# Patient Record
Sex: Male | Born: 1985 | Race: Black or African American | Hispanic: No | Marital: Single | Smoking: Never smoker
Health system: Southern US, Community
[De-identification: ages and names within clinical notes are randomized; demographics above are authoritative.]

## PROBLEM LIST (undated history)

## (undated) DIAGNOSIS — R600 Localized edema: Secondary | ICD-10-CM

## (undated) DIAGNOSIS — E785 Hyperlipidemia, unspecified: Secondary | ICD-10-CM

## (undated) DIAGNOSIS — I10 Essential (primary) hypertension: Secondary | ICD-10-CM

## (undated) DIAGNOSIS — N529 Male erectile dysfunction, unspecified: Secondary | ICD-10-CM

## (undated) DIAGNOSIS — E119 Type 2 diabetes mellitus without complications: Secondary | ICD-10-CM

## (undated) DIAGNOSIS — E663 Overweight: Secondary | ICD-10-CM

## (undated) HISTORY — DX: Overweight: E66.3

## (undated) HISTORY — DX: Male erectile dysfunction, unspecified: N52.9

## (undated) HISTORY — PX: KNEE SURGERY: SHX244

## (undated) HISTORY — DX: Hyperlipidemia, unspecified: E78.5

## (undated) HISTORY — DX: Localized edema: R60.0

---
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
## 2008-08-13 ENCOUNTER — Emergency Department (HOSPITAL_COMMUNITY): Admission: EM | Admit: 2008-08-13 | Discharge: 2008-08-13 | Payer: Self-pay | Admitting: Emergency Medicine

## 2009-07-09 ENCOUNTER — Emergency Department (HOSPITAL_COMMUNITY): Admission: EM | Admit: 2009-07-09 | Discharge: 2009-07-10 | Payer: Self-pay | Admitting: Emergency Medicine

## 2009-10-14 ENCOUNTER — Emergency Department (HOSPITAL_COMMUNITY): Admission: EM | Admit: 2009-10-14 | Discharge: 2009-10-14 | Payer: Self-pay | Admitting: Emergency Medicine

## 2009-10-15 ENCOUNTER — Inpatient Hospital Stay (HOSPITAL_COMMUNITY): Admission: EM | Admit: 2009-10-15 | Discharge: 2009-10-17 | Payer: Self-pay | Admitting: Emergency Medicine

## 2010-01-29 ENCOUNTER — Emergency Department (HOSPITAL_COMMUNITY): Admission: EM | Admit: 2010-01-29 | Discharge: 2010-01-29 | Payer: Self-pay | Admitting: Emergency Medicine

## 2010-05-05 ENCOUNTER — Emergency Department (HOSPITAL_COMMUNITY)
Admission: EM | Admit: 2010-05-05 | Discharge: 2010-05-05 | Payer: Self-pay | Source: Home / Self Care | Admitting: Emergency Medicine

## 2010-06-20 LAB — DIFFERENTIAL
Basophils Relative: 1 % (ref 0–1)
Eosinophils Absolute: 0.2 10*3/uL (ref 0.0–0.7)
Neutro Abs: 7.2 10*3/uL (ref 1.7–7.7)
Neutrophils Relative %: 67 % (ref 43–77)

## 2010-06-20 LAB — CBC
Hemoglobin: 16.6 g/dL (ref 13.0–17.0)
MCH: 27.8 pg (ref 26.0–34.0)
MCHC: 33.3 g/dL (ref 30.0–36.0)
Platelets: 334 10*3/uL (ref 150–400)
RBC: 5.96 MIL/uL — ABNORMAL HIGH (ref 4.22–5.81)

## 2010-06-20 LAB — POCT I-STAT, CHEM 8
Chloride: 108 mEq/L (ref 96–112)
HCT: 55 % — ABNORMAL HIGH (ref 39.0–52.0)
Hemoglobin: 18.7 g/dL — ABNORMAL HIGH (ref 13.0–17.0)
Potassium: 3.7 mEq/L (ref 3.5–5.1)
Sodium: 137 mEq/L (ref 135–145)

## 2010-06-20 LAB — POCT CARDIAC MARKERS
CKMB, poc: <1 ng/mL — ABNORMAL LOW (ref 1.0–8.0)
Myoglobin, poc: 68.3 ng/mL (ref 12–200)
Troponin i, poc: <0.05 ng/mL (ref 0.00–0.09)

## 2010-06-24 LAB — CBC
HCT: 38 % — ABNORMAL LOW (ref 39.0–52.0)
HCT: 42.5 % (ref 39.0–52.0)
Hemoglobin: 14.9 g/dL (ref 13.0–17.0)
MCHC: 34.3 g/dL (ref 30.0–36.0)
MCV: 83.2 fL (ref 78.0–100.0)
MCV: 84.4 fL (ref 78.0–100.0)
RBC: 5.11 MIL/uL (ref 4.22–5.81)
RDW: 12.6 % (ref 11.5–15.5)
RDW: 13.6 % (ref 11.5–15.5)
WBC: 18.7 10*3/uL — ABNORMAL HIGH (ref 4.0–10.5)

## 2010-06-24 LAB — BASIC METABOLIC PANEL
BUN: 17 mg/dL (ref 6–23)
Chloride: 100 mEq/L (ref 96–112)
Chloride: 112 mEq/L (ref 96–112)
GFR calc non Af Amer: >60 mL/min (ref >60)
Glucose, Bld: 136 mg/dL — ABNORMAL HIGH (ref 70–99)
Glucose, Bld: 161 mg/dL — ABNORMAL HIGH (ref 70–99)
Potassium: 3.3 mEq/L — ABNORMAL LOW (ref 3.5–5.1)
Potassium: 4.5 mEq/L (ref 3.5–5.1)
Sodium: 136 mEq/L (ref 135–145)

## 2010-06-24 LAB — CULTURE, BLOOD (ROUTINE X 2)
Culture: NO GROWTH
Culture: NO GROWTH

## 2010-06-24 LAB — WOUND CULTURE

## 2010-06-24 LAB — DIFFERENTIAL
Basophils Absolute: 0.2 10*3/uL — ABNORMAL HIGH (ref 0.0–0.1)
Eosinophils Relative: 1 % (ref 0–5)
Lymphocytes Relative: 14 % (ref 12–46)
Monocytes Relative: 11 % (ref 3–12)
Neutro Abs: 13.6 10*3/uL — ABNORMAL HIGH (ref 1.7–7.7)

## 2010-06-27 LAB — POCT I-STAT, CHEM 8
Calcium, Ion: 1.19 mmol/L (ref 1.12–1.32)
Creatinine, Ser: 1.3 mg/dL (ref 0.4–1.5)
Glucose, Bld: 116 mg/dL — ABNORMAL HIGH (ref 70–99)
Hemoglobin: 18.4 g/dL — ABNORMAL HIGH (ref 13.0–17.0)
TCO2: 30 mmol/L (ref 0–100)

## 2010-07-17 LAB — POCT CARDIAC MARKERS
CKMB, poc: 1.4 ng/mL (ref 1.0–8.0)
Troponin i, poc: <0.05 ng/mL (ref 0.00–0.09)

## 2010-07-17 LAB — POCT I-STAT, CHEM 8
Chloride: 104 mEq/L (ref 96–112)
Creatinine, Ser: 1.5 mg/dL (ref 0.4–1.5)
Glucose, Bld: 126 mg/dL — ABNORMAL HIGH (ref 70–99)
HCT: 54 % — ABNORMAL HIGH (ref 39.0–52.0)
Potassium: 3.7 mEq/L (ref 3.5–5.1)

## 2010-08-24 NOTE — Op Note (Signed)
NAME:  Francisco Carey, Francisco Carey NO.:  [id number]   MEDICAL RECORD NO.:  [id number]          PATIENT TYPE:  AMB   LOCATION:  DSC                          FACILITY:  [facility]   PHYSICIAN:  Erasmo Leventhal, M.D.DATE OF BIRTH:  [birth year]   DATE OF PROCEDURE:  06/19/2004  DATE OF DISCHARGE:                                 OPERATIVE REPORT   PREOPERATIVE DIAGNOSIS:  Right knee torn anterior cruciate ligament,  possible torn meniscus.   POSTOPERATIVE DIAGNOSIS:  Right knee complete rupture anterior cruciate  ligament, posterior horn torn medial meniscus, posterior horn torn lateral  meniscus.   PROCEDURE:  Right knee arthroscopically assisted anterior cruciate ligament  reconstruction, autograft, medial meniscal repair, partial lateral  meniscectomy.   SURGEON:  Erasmo Leventhal, M.D.   ASSISTANT:  Jaquelyn Bitter. Chabon, P.A.-C.   ANESTHESIA:  General, followed by knee and femoral nerve block.   ESTIMATED BLOOD LOSS:  Less than 20 mL.   DRAINS:  Wound Hemovac.   COMPLICATIONS:  None.   TOURNIQUET TIME:  1 hour 30 minutes at 350 mmHg.   COMPLICATIONS:  None.   DISPOSITION:  PACU, stable,   OPERATIVE DETAILS:  Patient and family counseled in the holding area.  Correct side was identified.  Chart was reviewed and signed appropriately.  IV started, antibiotics were given.  Taken to the OR, placed in the supine  position, with general anesthesia.  Knee was examined, 10 degrees of  recurvatum, 2+ Lachman, 2+ anterior drawer, positive flexion and rotation  drawer, stable to varus and valgus stress.  __________ degrees of flexion.  Negative sag sign, negative posterior drawer.  No evidence of posterolateral  instability.  Elevated, prepped with DuraPrep, draped in a sterile fashion.  Exsanguinated with an Esmarch.  Tourniquet was inflated to 350 mmHg.  A  straight midline incision was made through skin and subcutaneous tissues.  Small bleeders were  electrocoagulated.  Paratenon incised longitudinally.  Patellar tendon was over 30 mm in its widest diameter.  Following this, a  central 10 mm was taken as a free graft with a bone plug from each side.  He  had closing growth plates at this point in time.  There was no significant  tibial tubercle apophysis left.  We had a nice bone block there.  Grafts  were taken to the back table and were prepared by Mr. Jodene Nam.  Arthroscopic portals were now established, proximal, medial, anteromedial,  and anterolateral.  Diagnostic arthroscopy was undertaken.  The ACL was  completely ruptured.  The PCL was intact.  Notchplasty was performed in the  standard fashion.  Over-the-top position was confirmed.  ACL fibers were  debrided.  The patellofemoral joint was unremarkable.  Medial and lateral  gutters were inspected.  I thoroughly inspected the knee for a loose body,  and there was none encountered, since it was questioned on MRI scan.  There  was a small chondral defect in the lateral femoral condyle.  It did not  require grafting or chondroplasty.   The lateral meniscus was inspected.  There was a very complex irreparable  tear of the  posterior horn.  At this point in time, a partial lateral  meniscectomy was performed back to a stable rim.  The medial side was  inspected.  There was a posterior horn tear of the medial meniscus in an  unstable area in the healing area.  A meniscal rasp was utilized.  It was  reduced anatomically and securely fixed with Arthrex Bioscrews over the  meniscal areas.   The guide pins were placed in the ACL footprint.  A periosteal window was  made just medial to the tibial tubercle.  A guide pin was placed and  __________ the endoscopic reamer, a 7 mm over-the-top guide hooked to the  over-the-top position and set at 11 o'clock for the right knee.  Guide pin  was placed, engaged in the anterolateral cortex.  It was then endoscopically  reamed to the  appropriate depth.  Both tunnels were now debrided.  The  femoral tunnel was then notched.  A 2-pin passer was placed out through an  anterolateral puncture wound, and the graft was now delivered to the knee.  It was securely fixed with the Linvatec Bioscrews placed in parallel  fashion.  The knee was put through a range of motion.  There was no notch  impingement.  The graft had excellent physiometric movement pattern and  pulling distally on the graft lateral to the __________, I then securely  fixed the graft and  Linvatec Bioscrews were placed in parallel fashion in  the tibial tunnel.  I also inspected the tunnel, and there was no  significant physis remaining open at this time.  I felt very secure with  this surgical procedure.  The knee was put through a range of motion, with  excellent fixation on both femoral and tibial sides.  There was no notch  impingement.  The graft had excellent orientation.  The knee was very stable  clinically.  Irrigated and endoscopic equipment was removed.  A drain was  placed __________ the cannula and it was removed.  Sequential closure of  layers was done, periosteum with Vicryl, patellar tendon with Vicryl,  paratenon with Vicryl, subcutaneous with Monocryl.  After performing  anesthesia,  another 15 mL of 0.25% Marcaine with epinephrine __________  hemostasis.  __________ knee.  Drain hooked to suction.  The tourniquet was  deflated.  Another gram of Ancef was given intravenously.  At this point in  time, the femoral nerve block was then administered.  Sponge and needle  count were correct.  There were no complications.  The patient tolerated the  procedure well.  The patient was taken from the operating room to the PACU  in stable condition.   To decrease surgical time held throughout the entire surgical procedure, Mr.  Jeannett Senior Chabon's assistance was needed.     RAC/MEDQ  D:  06/19/2004  T:  06/19/2004  Job:  [id number]   cc:   Erasmo Leventhal, M.D.  [facility]  [address]  [address]  [place]  [place] [address]  Fax: [phone]

## 2011-04-20 ENCOUNTER — Encounter (HOSPITAL_COMMUNITY): Payer: Self-pay

## 2011-04-20 ENCOUNTER — Emergency Department (HOSPITAL_COMMUNITY)
Admission: EM | Admit: 2011-04-20 | Discharge: 2011-04-20 | Disposition: A | Discharge status: Home / Self Care | Payer: 59 | Attending: Emergency Medicine | Admitting: Emergency Medicine

## 2011-04-20 DIAGNOSIS — I10 Essential (primary) hypertension: Secondary | ICD-10-CM

## 2011-04-20 DIAGNOSIS — R1310 Dysphagia, unspecified: Secondary | ICD-10-CM

## 2011-04-20 MED ORDER — OMEPRAZOLE 20 MG PO CPDR: 40.0000 mg | DELAYED_RELEASE_CAPSULE | Freq: Every day | ORAL | Status: DC

## 2011-04-20 NOTE — ED Notes (Signed)
Pt c/o acid reflux, sts he can feel a node when he swallow, and painful to swallow. Denied epigastric pain.

## 2011-04-20 NOTE — ED Provider Notes (Signed)
History     CSN: [id number]  Arrival date & time 04/20/11  0827   First MD Initiated Contact with Patient 04/20/11 [phone]      Chief Complaint: Painful swallowing  The history is provided by the patient.  painful swallowing  Onset - several days ago Duration - intermittent with swallowing Course - worsening Associated symptoms - burning in throat Pt denies cp/sob/nausea/vomiting/melena/rectal bleeding/abdominal pain/cough/fever Denies foreign body ingestion  Pt reports that when he swallow he feels "a knot" in his chest Also feels some burning in chest/abdomen after eating Denies heavy NSAID use     PMH - hypertension   No past surgical history on file.  No family history on file.  History  Substance Use Topics  . Smoking status: Never Smoker   . Smokeless tobacco: Not on file  . Alcohol Use: No      Review of Systems  Constitutional: Negative for fever.  Gastrointestinal: Negative for vomiting.    Allergies  Review of patient's allergies indicates no known allergies.  Home Medications   Current Outpatient Rx  Name Route Sig Dispense Refill  . OMEPRAZOLE 20 MG PO CPDR Oral Take 2 capsules (40 mg total) by mouth daily. 30 capsule 0    BP 148/88  Pulse 102  Temp(Src) 98.3 F (36.8 C) (Oral)  Resp 20  SpO2 98%  Physical Exam CONSTITUTIONAL: Well developed/well nourished HEAD AND FACE: Normocephalic/atraumatic EYES: EOMI ENMT: Mucous membranes moist, uvula midline, pharynx normal, voice normal Lymph - no cervical LAD, no tenderness NECK: supple no meningeal signs SPINE: No T/L spinal tenderness CV: S1/S2 noted, no murmurs/rubs/gallops noted LUNGS: Lungs are clear to auscultation bilaterally, no apparent distress ABDOMEN: soft, nontender, no rebound or guarding GU:no cva tenderness NEURO: Pt is awake/alert, moves all extremitiesx4 EXTREMITIES: pulses normal, full ROM SKIN: warm, color normal PSYCH: no abnormalities of mood noted  ED Course    Procedures   Advised to f/u with PCP for HTN and if symptoms do not improve with omeprazole   1. Hypertension   2. Odynophagia       MDM  Nursing notes reviewed and considered in documentation         Joya Gaskins, MD 04/20/11 [phone]

## 2012-02-16 ENCOUNTER — Encounter (HOSPITAL_COMMUNITY): Payer: Self-pay | Admitting: Emergency Medicine

## 2012-02-16 ENCOUNTER — Observation Stay (HOSPITAL_COMMUNITY)
Admission: EM | Admit: 2012-02-16 | Discharge: 2012-02-16 | Disposition: A | Discharge status: Home / Self Care | Payer: 59 | Attending: Emergency Medicine | Admitting: Emergency Medicine

## 2012-02-16 ENCOUNTER — Observation Stay (HOSPITAL_COMMUNITY): Payer: 59

## 2012-02-16 DIAGNOSIS — G459 Transient cerebral ischemic attack, unspecified: Secondary | ICD-10-CM | POA: Diagnosis present

## 2012-02-16 DIAGNOSIS — E119 Type 2 diabetes mellitus without complications: Secondary | ICD-10-CM

## 2012-02-16 DIAGNOSIS — R200 Anesthesia of skin: Secondary | ICD-10-CM

## 2012-02-16 DIAGNOSIS — I10 Essential (primary) hypertension: Secondary | ICD-10-CM | POA: Insufficient documentation

## 2012-02-16 DIAGNOSIS — E785 Hyperlipidemia, unspecified: Secondary | ICD-10-CM

## 2012-02-16 DIAGNOSIS — R209 Unspecified disturbances of skin sensation: Principal | ICD-10-CM | POA: Insufficient documentation

## 2012-02-16 DIAGNOSIS — Z79899 Other long term (current) drug therapy: Secondary | ICD-10-CM | POA: Insufficient documentation

## 2012-02-16 LAB — POCT I-STAT, CHEM 8
Calcium, Ion: 1.1 mmol/L — ABNORMAL LOW (ref 1.12–1.23)
Glucose, Bld: 382 mg/dL — ABNORMAL HIGH (ref 70–99)
HCT: 47 % (ref 39.0–52.0)
Hemoglobin: 16 g/dL (ref 13.0–17.0)
Potassium: 4.6 mEq/L (ref 3.5–5.1)

## 2012-02-16 LAB — LIPID PANEL: LDL Cholesterol: UNDETERMINED mg/dL (ref 0–99)

## 2012-02-16 LAB — CBC
HCT: 41.8 % (ref 39.0–52.0)
Platelets: 342 10*3/uL (ref 150–400)
RDW: 14.3 % (ref 11.5–15.5)
WBC: 8.9 10*3/uL (ref 4.0–10.5)

## 2012-02-16 LAB — HEMOGLOBIN A1C: Hgb A1c MFr Bld: 13 % — ABNORMAL HIGH (ref <5.7)

## 2012-02-16 MED ORDER — GEMFIBROZIL 600 MG PO TABS: 600.0000 mg | ORAL_TABLET | Freq: Two times a day (BID) | ORAL | Status: DC

## 2012-02-16 MED ORDER — SODIUM CHLORIDE 0.9 % IV SOLN
INTRAVENOUS | Status: DC
  Administered 2012-02-16: 02:00:00 via INTRAVENOUS

## 2012-02-16 MED ORDER — ENALAPRIL MALEATE 10 MG PO TABS: 10.0000 mg | ORAL_TABLET | Freq: Every day | ORAL | Status: DC

## 2012-02-16 MED ORDER — LIVING WELL WITH DIABETES BOOK
Freq: Once | Status: AC
  Administered 2012-02-16: 15:00:00
  Filled 2012-02-16: qty 1

## 2012-02-16 MED ORDER — INSULIN ASPART 100 UNIT/ML ~~LOC~~ SOLN
7.0000 [IU] | Freq: Once | SUBCUTANEOUS | Status: AC
  Administered 2012-02-16: 7 [IU] via INTRAVENOUS
  Filled 2012-02-16: qty 1

## 2012-02-16 MED ORDER — SITAGLIPTIN PHOS-METFORMIN HCL 50-500 MG PO TABS: 1.0000 | ORAL_TABLET | Freq: Two times a day (BID) | ORAL | Status: DC

## 2012-02-16 MED ORDER — ASPIRIN EC 325 MG PO TBEC: 325.0000 mg | DELAYED_RELEASE_TABLET | Freq: Every day | ORAL | Status: DC

## 2012-02-16 MED ORDER — SODIUM CHLORIDE 0.9 % IV BOLUS (SEPSIS)
1000.0000 mL | Freq: Once | INTRAVENOUS | Status: AC
  Administered 2012-02-16: 1000 mL via INTRAVENOUS

## 2012-02-16 NOTE — ED Notes (Signed)
Error in documention on MR Brain

## 2012-02-16 NOTE — ED Notes (Signed)
Dr. Dierdre Highman notified of blood glucose level (363 mg/dL)

## 2012-02-16 NOTE — ED Provider Notes (Signed)
History     CSN: [id number]  Arrival date & time 02/16/12  0014   First MD Initiated Contact with Patient 02/16/12 0045      Chief Complaint  Patient presents with  . Numbness    (Consider location/radiation/quality/duration/timing/severity/associated sxs/prior treatment) HPI Hx per PT, at home watching TV and fell asleep around 11:30pm, woke about 45 min later with L facial numbness, involved entire face,. No assocaited weakness. Symptoms completely resolved, lasted less than an hour. No HA, no neck pain. Called his father after he woke up and father bedside denies any slurred speech. No trouble with vision or gait. Denies drugs or etoh. No h/o same. MOD in severity.   History reviewed. No pertinent past medical history.  Past Surgical History  Procedure Date  . Knee surgery     History reviewed. No pertinent family history.  History  Substance Use Topics  . Smoking status: Never Smoker   . Smokeless tobacco: Not on file  . Alcohol Use: No      Review of Systems  Constitutional: Negative for fever and chills.  HENT: Negative for neck pain and neck stiffness.   Eyes: Negative for pain.  Respiratory: Negative for shortness of breath.   Cardiovascular: Negative for chest pain.  Gastrointestinal: Negative for abdominal pain.  Genitourinary: Negative for dysuria.  Musculoskeletal: Negative for back pain.  Skin: Negative for rash.  Neurological: Positive for numbness. Negative for syncope, facial asymmetry, speech difficulty, weakness and headaches.  All other systems reviewed and are negative.    Allergies  Review of patient's allergies indicates no known allergies.  Home Medications   Current Outpatient Rx  Name  Route  Sig  Dispense  Refill  . CETIRIZINE HCL 10 MG PO TABS   Oral   Take 10 mg by mouth daily as needed. For allergy relief           BP 159/97  Pulse 94  Temp 98 F (36.7 C) (Oral)  Resp 20  SpO2 99%  Physical Exam  Constitutional:  He is oriented to person, place, and time. He appears well-developed and well-nourished.  HENT:  Head: Normocephalic and atraumatic.  Eyes: Conjunctivae normal and EOM are normal. Pupils are equal, round, and reactive to light.  Neck: Full passive range of motion without pain. Neck supple. No thyromegaly present.       No meningismus  Cardiovascular: Normal rate, regular rhythm, S1 normal, S2 normal and intact distal pulses.   Pulmonary/Chest: Effort normal and breath sounds normal.  Abdominal: Soft. Bowel sounds are normal. There is no tenderness. There is no CVA tenderness.  Musculoskeletal: Normal range of motion.  Neurological: He is alert and oriented to person, place, and time. He has normal strength and normal reflexes. No cranial nerve deficit or sensory deficit. He displays a negative Romberg sign. GCS eye subscore is 4. GCS verbal subscore is 5. GCS motor subscore is 6.       No facial sensory or motor deficits, sensorium to light touch equal and intact. Normal Gait  Skin: Skin is warm and dry. No rash noted. No cyanosis. Nails show no clubbing.  Psychiatric: He has a normal mood and affect. His speech is normal and behavior is normal.    ED Course  Procedures (including critical care time)  Results for orders placed during the hospital encounter of 02/16/12  CBC      Component Value Range   WBC 8.9  4.0 - 10.5 K/uL   RBC 5.10  4.22 - 5.81 MIL/uL   Hemoglobin 15.1  13.0 - 17.0 g/dL   HCT 16.1  09.6 - 04.5 %   MCV 82.0  78.0 - 100.0 fL   MCH 29.6  26.0 - 34.0 pg   MCHC 36.1 (*) 30.0 - 36.0 g/dL   RDW 40.9  81.1 - 91.4 %   Platelets 342  150 - 400 K/uL  LIPID PANEL      Component Value Range   Cholesterol 598 (*) 0 - 200 mg/dL   Triglycerides 7829 (*) <150 mg/dL   HDL NOT REPORTED DUE TO HIGH TRIGLYCERIDES  >39 mg/dL   Total CHOL/HDL Ratio NOT REPORTED DUE TO HIGH TRIGLYCERIDES     VLDL UNABLE TO CALCULATE IF TRIGLYCERIDE OVER 400 mg/dL  0 - 40 mg/dL   LDL Cholesterol  UNABLE TO CALCULATE IF TRIGLYCERIDE OVER 400 mg/dL  0 - 99 mg/dL  POCT I-STAT, CHEM 8      Component Value Range   Sodium 131 (*) 135 - 145 mEq/L   Potassium 4.6  3.5 - 5.1 mEq/L   Chloride 101  96 - 112 mEq/L   BUN 18  6 - 23 mg/dL   Creatinine, Ser 5.62  0.50 - 1.35 mg/dL   Glucose, Bld 130 (*) 70 - 99 mg/dL   Calcium, Ion 8.65 (*) 1.12 - 1.23 mmol/L   TCO2 26  0 - 100 mmol/L   Hemoglobin 16.0  13.0 - 17.0 g/dL   HCT 78.4  69.6 - 29.5 %  GLUCOSE, CAPILLARY      Component Value Range   Glucose-Capillary 351 (*) 70 - 99 mg/dL  GLUCOSE, CAPILLARY      Component Value Range   Glucose-Capillary 363 (*) 70 - 99 mg/dL   Comment 1 Documented in Chart     Comment 2 Notify RN       Date: 02/16/2012  Rate: 95  Rhythm: normal sinus rhythm  QRS Axis: normal  Intervals: normal  ST/T Wave abnormalities: nonspecific ST changes  Conduction Disutrbances:none  Narrative Interpretation:   Old EKG Reviewed: none available  1:12 AM d/w Dr Amada Jupiter, NEU, feels low risk for TIA given age, but does rec MRI to further evaluate symptoms. He agrees no indication for CT brain with normal neuro exam at this time. PT agreeable to stay for OBS and placed on protocol.   Found to have elevated blood sugar, new onset diabetes. IVFs. Lipid panel pending  6:45 AM recheck blood sugar still elevated given sub Q insulin, MRI pending, has elevated lipids.   PLAN: after MRi results, will consult NEU again for further recs. Has PCP follow up. Will need RX for hyperlipidemia.  TIA OBSERVATION PROTOCOL  MDM   L facial paraesthesias. ECG reviewed NSR. Labs and plan MRI in am. Will monitor BP, ABCD2 score = 2        Sunnie Nielsen, MD 02/16/12 2256

## 2012-02-16 NOTE — Consult Note (Signed)
[facility]  PATIENT DETAILS Name: Francisco Carey Age: 26 y.o. Sex: male Date of Birth: 1985-06-25 Admit Date: 02/16/2012 PCP:Pcp Not In System Requesting MD: Sunnie Nielsen, MD  Date of consultation: 02/16/12  REASON FOR CONSULTATION:  Management of diabetes, hypertension and dyslipidemia  Impression/Recommendations Principal Problem:  *TIA (transient ischemic attack) -per neurology. -EDP has already consulted neurology, MRI of the brain is negative, per neurology patient can have the rest of his workup done as an outpatient. I would at least a discharge as patient on aspirin.  Active Problems:  DM (diabetes mellitus) -A1c is 13.0, patient does have polyuria and polydipsia. Long conversation with patient and father, at this point they would prefer not to start on insulin and just start on oral hypoglycemic regimen. Likely this patient has type 2 diabetes, however he is relatively young and could have type1 diabetes as well.For now I will recommend discharging this patient on a combination of metformin and Januvia (Janumet 50/500 1 tab PO BID).He chooses to have further blood work including anti-insulin antibodies, anti-GAD antibodies etc at his primary care practitioner's office in an attempt to differentiate between type I and type 2 diabetes.Patient and the patient's father at bedside claimed that they would make an appointment with the patient's primary care practitioner Dr. Laurine Blazer for tomorrow or the day after. -will need extensive diabetic education, and nutritional evaluation which can be made by the patient's primary care practitioner   HTN (hypertension) -given his history of diabetes, would put him on enalapril 10 mg by mouth daily   Dyslipidemia -Start Lopid 600 mg by mouth twice a day -LFT monitoring as an outpatient   Thank you for this consultation.  [facility] Pager [phone]  HPI: Patient is a 26 year old African  American male history of hypertension but noncompliant to medications who fell asleep last evening and woke up with left facial numbness. He was then brought to the ED for further evaluation, a MRI of the brain was subsequently done which did not show any stroke. However the patient was found to have diabetes, hypertension and dyslipidemia. Neurology was consulted by the emergency department physician, they recommended the patient be placed on aspirin and have the remainder his TIA workup as an outpatient.I was asked to consult on this patient for management of his diabetes, hypertension and dyslipidemia. During my evaluation, patient is left-sided fascial numbness has resolved, he denies any other complaints. He denies any chest pain or shortness of breath. His speech is clear.the patient has been noncompliant with his antihypertensive therapy, as he claims that he doesn't like the way the medication makes him feel.   ALLERGIES:  No Known Allergies  PAST MEDICAL HISTORY: hypertension  PAST SURGICAL HISTORY: Past Surgical History  Procedure Date  . Knee surgery     MEDICATIONS AT HOME: Prior to Admission medications   Medication Sig Start Date End Date Taking? Authorizing Provider  cetirizine (ZYRTEC) 10 MG tablet Take 10 mg by mouth daily as needed. For allergy relief   Yes Historical Provider, MD    FAMILY HISTORY: No family history of CAD or CVA  SOCIAL HISTORY:  reports that he has never smoked. He does not have any smokeless tobacco history on file. He reports that he does not drink alcohol or use illicit drugs.  REVIEW OF SYSTEMS:  Constitutional:   No  weight loss, night sweats,  Fevers, chills, fatigue.  HEENT:    No headaches, Difficulty swallowing,Tooth/dental problems,Sore throat,  No sneezing, itching, ear ache,  nasal congestion, post nasal drip,   Cardio-vascular: No chest pain,  Orthopnea, PND, swelling in lower extremities, anasarca, dizziness, palpitations  GI:    No heartburn, indigestion, abdominal pain, nausea, vomiting, diarrhea, change in       bowel habits, loss of appetite  Resp: No shortness of breath with exertion or at rest.  No excess mucus, no productive cough, No non-productive cough,  No coughing up of blood.No change in color of mucus.No wheezing.No chest wall deformity  Skin:  no rash or lesions.  GU:  no dysuria, change in color of urine, no urgency or frequency.  No flank pain.  Musculoskeletal: No joint pain or swelling.  No decreased range of motion.  No back pain.  Psych: No change in mood or affect. No depression or anxiety.  No memory loss.   PHYSICAL EXAM: Blood pressure 140/80, pulse 74, temperature 97 F (36.1 C), temperature source Oral, resp. rate 18, SpO2 99.00%.  General appearance :Awake, alert, not in any distress. Speech Clear. Not toxic Looking HEENT: Atraumatic and Normocephalic, pupils equally reactive to light and accomodation Neck: supple, no JVD. No cervical lymphadenopathy.  Chest:Good air entry bilaterally, no added sounds  CVS: S1 S2 regular, no murmurs.  Abdomen: Bowel sounds present, Non tender and not distended with no gaurding, rigidity or rebound. Extremities: B/L Lower Ext shows no edema, both legs are warm to touch, with  dorsalis pedis pulses palpable. Neurology: Awake alert, and oriented X 3, CN II-XII intact, Non focal Skin:No Rash Wounds:N/A  LABS ON ADMISSION:   Basename 02/16/12 0120  NA 131*  K 4.6  CL 101  CO2 --  GLUCOSE 382*  BUN 18  CREATININE 1.20  CALCIUM --  MG --  PHOS --   No results found for this basename: AST:2,ALT:2,ALKPHOS:2,BILITOT:2,PROT:2,ALBUMIN:2 in the last 72 hours No results found for this basename: LIPASE:2,AMYLASE:2 in the last 72 hours  Basename 02/16/12 0120 02/16/12 0105  WBC -- 8.9  NEUTROABS -- --  HGB 16.0 15.1  HCT 47.0 41.8  MCV -- 82.0  PLT -- 342   No results found for this basename: CKTOTAL:3,CKMB:3,CKMBINDEX:3,TROPONINI:3 in  the last 72 hours No results found for this basename: DDIMER:2 in the last 72 hours No components found with this basename: POCBNP:3   RADIOLOGIC STUDIES ON ADMISSION: Mr Brain Wo Contrast  02/16/2012  *RADIOLOGY REPORT*  Clinical Data: Left-sided facial numbness and left arm numbness  MRI HEAD WITHOUT CONTRAST  Technique:  Multiplanar, multiecho pulse sequences of the brain and surrounding structures were obtained according to standard protocol without intravenous contrast.  Comparison: None.  Findings: The brain has a normal appearance on all pulse sequences without evidence of malformation, atrophy, old or acute infarction, mass lesion, hemorrhage, hydrocephalus or extra-axial collection. No pituitary mass.  No skull or skull base lesion.  There are mucosal inflammatory changes of the maxillary sinuses.  IMPRESSION: Normal appearance of the brain.  Mucosal inflammation of the maxillary sinuses.   Original Report Authenticated By: Paulina Fusi, M.D.    Total time spent 45 minutes.  [facility] Pager [phone]  If 7PM-7AM, please contact night-coverage [email or web address] Password [email or web address] 02/16/2012, 1:55 PM

## 2012-02-16 NOTE — ED Provider Notes (Signed)
7:44 AM Patient is in CDU under observation, TIA protocol.  This is a shared visit with Dr Dierdre Highman.  Sign out received from Dr Dierdre Highman.  Pt is 26 years old, presented with left sided facial numbness.  Pt found to be newly hypertensive, newly diabetic, with an unmeasureable lipid panel (total cholesterol 598).  Plan is for MRI brain, consult to neurology, and a medical consult vs medical admission.    9:35 AM Patient has finished all ordered testing.  Discussed MRI results and diagnosis with patient, as well as plan for neuro and possibly medicine consult, possible admission if recommended.  Pt verbalizes understanding and agrees with plan.  Pt reports left facial and left shoulder numbness yesterday lasted approximately 30 minutes and resolved.  Denies any numbness.  Denies any recurrence or any current symptoms.  On exam, pt is A&O, NAD, CN II-XII intact, EOMs intact, no pronator drift, grip strengths equal bilaterally; strength 5/5 in all extremities, sensation intact in all extremities; finger to nose, heel to shin, rapid alternating movements normal; gait is normal.    10:26 AM I spoke with Dr Thad Ranger.  From a neurological standpoint, patient may have the rest of his workup done as an outpatient and be discharged home on a daily aspirin.  Does not need admission for further workup of TIA.  However, given new findings of HTN, DM, hypercholesterolemia, will defer to medicine and ED for decisions on how to manage these new diagnoses.  I have placed a call to hospitalist for consult.    10:46 AM I spoke with Dr Jerral Ralph who will consult once Hgb A1C has resulted and give recommendations on his medications.  Anticipate d/c home following consult.    2:17 PM Dr Jerral Ralph has seen the patient and written recommendations for medications in a consult note.  We appreciate his consultation.  Pt to be discharged on medications for DM, HTN, hyperlipidemia, as well as an aspirin given possible TIA.  Pt to follow up with PCP  tomorrow.  Patient and father verbalize understanding and agree with plan.    Filed Vitals:   02/16/12 1108  BP: 140/80  Pulse:   Temp:   Resp: 18     Results for orders placed during the hospital encounter of 02/16/12  CBC      Component Value Range   WBC 8.9  4.0 - 10.5 K/uL   RBC 5.10  4.22 - 5.81 MIL/uL   Hemoglobin 15.1  13.0 - 17.0 g/dL   HCT 40.9  81.1 - 91.4 %   MCV 82.0  78.0 - 100.0 fL   MCH 29.6  26.0 - 34.0 pg   MCHC 36.1 (*) 30.0 - 36.0 g/dL   RDW 78.2  95.6 - 21.3 %   Platelets 342  150 - 400 K/uL  LIPID PANEL      Component Value Range   Cholesterol 598 (*) 0 - 200 mg/dL   Triglycerides 0865 (*) <150 mg/dL   HDL NOT REPORTED DUE TO HIGH TRIGLYCERIDES  >39 mg/dL   Total CHOL/HDL Ratio NOT REPORTED DUE TO HIGH TRIGLYCERIDES     VLDL UNABLE TO CALCULATE IF TRIGLYCERIDE OVER 400 mg/dL  0 - 40 mg/dL   LDL Cholesterol UNABLE TO CALCULATE IF TRIGLYCERIDE OVER 400 mg/dL  0 - 99 mg/dL  HEMOGLOBIN H8I      Component Value Range   Hemoglobin A1C 13.0 (*) <5.7 %   Mean Plasma Glucose 326 (*) <117 mg/dL  POCT I-STAT, CHEM 8  Component Value Range   Sodium 131 (*) 135 - 145 mEq/L   Potassium 4.6  3.5 - 5.1 mEq/L   Chloride 101  96 - 112 mEq/L   BUN 18  6 - 23 mg/dL   Creatinine, Ser 1.61  0.50 - 1.35 mg/dL   Glucose, Bld 096 (*) 70 - 99 mg/dL   Calcium, Ion 0.45 (*) 1.12 - 1.23 mmol/L   TCO2 26  0 - 100 mmol/L   Hemoglobin 16.0  13.0 - 17.0 g/dL   HCT 40.9  81.1 - 91.4 %  GLUCOSE, CAPILLARY      Component Value Range   Glucose-Capillary 351 (*) 70 - 99 mg/dL  GLUCOSE, CAPILLARY      Component Value Range   Glucose-Capillary 363 (*) 70 - 99 mg/dL   Comment 1 Documented in Chart     Comment 2 Notify RN    GLUCOSE, CAPILLARY      Component Value Range   Glucose-Capillary 316 (*) 70 - 99 mg/dL   Mr Brain Wo Contrast  02/16/2012  *RADIOLOGY REPORT*  Clinical Data: Left-sided facial numbness and left arm numbness  MRI HEAD WITHOUT CONTRAST  Technique:   Multiplanar, multiecho pulse sequences of the brain and surrounding structures were obtained according to standard protocol without intravenous contrast.  Comparison: None.  Findings: The brain has a normal appearance on all pulse sequences without evidence of malformation, atrophy, old or acute infarction, mass lesion, hemorrhage, hydrocephalus or extra-axial collection. No pituitary mass.  No skull or skull base lesion.  There are mucosal inflammatory changes of the maxillary sinuses.  IMPRESSION: Normal appearance of the brain.  Mucosal inflammation of the maxillary sinuses.   Original Report Authenticated By: Paulina Fusi, M.D.       [place], [place] 02/16/12 1557

## 2012-02-16 NOTE — ED Provider Notes (Signed)
Medical screening examination/treatment/procedure(s) were conducted as a shared visit with non-physician practitioner(s) and myself.  I personally evaluated the patient during the encounter  Sunnie Nielsen, MD 02/16/12 2257

## 2012-02-16 NOTE — ED Notes (Signed)
MD at bedside. 

## 2012-02-16 NOTE — ED Notes (Signed)
Pt. Was awaken from sleep with left sided facial numbness radiating to the left shoulder.  Symptoms just began this evening. No pain/no obvious deformities or prior injuries.  Pt. Stable at this time.

## 2012-03-24 ENCOUNTER — Emergency Department (HOSPITAL_COMMUNITY)
Admission: EM | Admit: 2012-03-24 | Discharge: 2012-03-24 | Disposition: A | Discharge status: Home / Self Care | Payer: 59 | Attending: Emergency Medicine | Admitting: Emergency Medicine

## 2012-03-24 ENCOUNTER — Encounter (HOSPITAL_COMMUNITY): Payer: Self-pay | Admitting: *Deleted

## 2012-03-24 DIAGNOSIS — Z79899 Other long term (current) drug therapy: Secondary | ICD-10-CM | POA: Insufficient documentation

## 2012-03-24 DIAGNOSIS — E1169 Type 2 diabetes mellitus with other specified complication: Secondary | ICD-10-CM | POA: Insufficient documentation

## 2012-03-24 DIAGNOSIS — R739 Hyperglycemia, unspecified: Secondary | ICD-10-CM

## 2012-03-24 DIAGNOSIS — M5412 Radiculopathy, cervical region: Secondary | ICD-10-CM | POA: Insufficient documentation

## 2012-03-24 DIAGNOSIS — Z794 Long term (current) use of insulin: Secondary | ICD-10-CM | POA: Insufficient documentation

## 2012-03-24 DIAGNOSIS — Z7982 Long term (current) use of aspirin: Secondary | ICD-10-CM | POA: Insufficient documentation

## 2012-03-24 HISTORY — DX: Type 2 diabetes mellitus without complications: E11.9

## 2012-03-24 LAB — GLUCOSE, CAPILLARY

## 2012-03-24 MED ORDER — INSULIN DETEMIR 100 UNIT/ML ~~LOC~~ SOLN: 10.0000 [IU] | Freq: Every day | SUBCUTANEOUS | Status: DC

## 2012-03-24 NOTE — ED Notes (Addendum)
C/oL neck burning, onset 30 minutes ago, onset while lying on couch, h/o similar, no meds PTA, radiates to shoulder, denies neck injury /problems, radial pulses strong and equal. (Denies: sob, swelling, sore throat, fever, nvd), takes levemir, ran out of insulin and test strips, "has not had insulin in awhile". LS CTA, throat unremarkable. CMS intact BUE. Pt of Dr. Paulino Rily.

## 2012-03-24 NOTE — ED Provider Notes (Signed)
History     CSN: [id number]  Arrival date & time 03/24/12  0134   First MD Initiated Contact with Patient 03/24/12 0208      Chief Complaint  Patient presents with  . Neck Pain    (Consider location/radiation/quality/duration/timing/severity/associated sxs/prior treatment) HPI Comments: 26 year old male who presents with a complaint of left neck burning. He states this was acute in onset, he awoke from sleep while he was laying on the couch with this pain, it lasted for a short time and is completely resolved. It did not radiate into his chest or his back and he had no associated numbness or weakness of his upper extremities. He denies any history of similar symptoms though he is a diabetic and has not had any of his insulin in 2 days.  Patient is a 26 y.o. male presenting with neck pain. The history is provided by the patient and a relative.  Neck Pain  Pertinent negatives include no numbness and no weakness.    Past Medical History  Diagnosis Date  . Diabetes mellitus without complication     Past Surgical History  Procedure Date  . Knee surgery   . Knee surgery     No family history on file.  History  Substance Use Topics  . Smoking status: Never Smoker   . Smokeless tobacco: Not on file  . Alcohol Use: No      Review of Systems  HENT: Positive for neck pain.   Neurological: Negative for weakness and numbness.    Allergies  Review of patient's allergies indicates no known allergies.  Home Medications   Current Outpatient Rx  Name  Route  Sig  Dispense  Refill  . ASPIRIN EC 325 MG PO TBEC   Oral   Take 1 tablet (325 mg total) by mouth daily.   30 tablet   0   . GEMFIBROZIL 600 MG PO TABS   Oral   Take 1 tablet (600 mg total) by mouth 2 (two) times daily before a meal.   30 tablet   0   . LOSARTAN POTASSIUM PO   Oral   Take 1 tablet by mouth daily.         . INSULIN DETEMIR 100 UNIT/ML [place] SOLN   Subcutaneous   Inject 10 Units into the skin  at bedtime.   3 mL   30     BP 160/100  Pulse 80  Temp 97.6 F (36.4 C) (Oral)  Resp 18  SpO2 99%  Physical Exam  Nursing note and vitals reviewed. Constitutional: He appears well-developed and well-nourished.  HENT:  Head: Normocephalic and atraumatic.  Eyes: Conjunctivae normal are normal. No scleral icterus.  Cardiovascular: Normal rate, regular rhythm and intact distal pulses.   Pulmonary/Chest: Effort normal and breath sounds normal.  Abdominal: Soft.       No pulsating masses, no guarding, no tenderness  Musculoskeletal: He exhibits no tenderness (No tenderness to the lumbar, thoracic or cervical spines, no paraspinal tenderness).       No spinal tenderness of the cervical, thoracic or lumbar spines normal range of motion of the left shoulder without any pain or weakness  Neurological: He is alert.       Gait is antalgic secondary to low back pain, isolated strength of the bilateral lower extremities is normal, sensation normal, speech normal. Normal strength and sensation of the bilateral upper extremities   Skin: Skin is warm and dry. No erythema.    ED Course  Procedures (  including critical care time)  Labs Reviewed  GLUCOSE, CAPILLARY - Abnormal; Notable for the following:    Glucose-Capillary 222 (*)     All other components within normal limits   No results found.   1. Radiculopathy of cervical region   2. Hyperglycemia       MDM  Suspected radiculopathy that has resolved, refill insulin, discharged home, resource list given for followup.        Vida Roller, MD 03/24/12 [phone]

## 2012-05-25 ENCOUNTER — Encounter (HOSPITAL_COMMUNITY): Payer: Self-pay | Admitting: *Deleted

## 2012-05-25 ENCOUNTER — Emergency Department (HOSPITAL_COMMUNITY)
Admission: EM | Admit: 2012-05-25 | Discharge: 2012-05-25 | Disposition: A | Discharge status: Home / Self Care | Payer: 59 | Attending: Emergency Medicine | Admitting: Emergency Medicine

## 2012-05-25 DIAGNOSIS — Z794 Long term (current) use of insulin: Secondary | ICD-10-CM | POA: Insufficient documentation

## 2012-05-25 DIAGNOSIS — Z7982 Long term (current) use of aspirin: Secondary | ICD-10-CM | POA: Insufficient documentation

## 2012-05-25 DIAGNOSIS — I10 Essential (primary) hypertension: Secondary | ICD-10-CM | POA: Insufficient documentation

## 2012-05-25 DIAGNOSIS — E119 Type 2 diabetes mellitus without complications: Secondary | ICD-10-CM

## 2012-05-25 DIAGNOSIS — Z79899 Other long term (current) drug therapy: Secondary | ICD-10-CM | POA: Insufficient documentation

## 2012-05-25 HISTORY — DX: Essential (primary) hypertension: I10

## 2012-05-25 LAB — GLUCOSE, CAPILLARY

## 2012-05-25 LAB — URINALYSIS, ROUTINE W REFLEX MICROSCOPIC
Bilirubin Urine: NEGATIVE
Hgb urine dipstick: NEGATIVE
Protein, ur: NEGATIVE mg/dL
Specific Gravity, Urine: 1.024 (ref 1.005–1.030)
Urobilinogen, UA: 0.2 mg/dL (ref 0.0–1.0)

## 2012-05-25 LAB — COMPREHENSIVE METABOLIC PANEL
ALT: 18 U/L (ref 0–53)
AST: 15 U/L (ref 0–37)
CO2: 25 mEq/L (ref 19–32)
Chloride: 99 mEq/L (ref 96–112)
GFR calc non Af Amer: >90 mL/min (ref >90)
Sodium: 135 mEq/L (ref 135–145)
Total Bilirubin: 0.3 mg/dL (ref 0.3–1.2)

## 2012-05-25 LAB — CBC WITH DIFFERENTIAL/PLATELET
Basophils Absolute: 0 10*3/uL (ref 0.0–0.1)
Lymphocytes Relative: 32 % (ref 12–46)
Neutro Abs: 5.5 10*3/uL (ref 1.7–7.7)
Neutrophils Relative %: 55 % (ref 43–77)
Platelets: 350 10*3/uL (ref 150–400)
RDW: 13.7 % (ref 11.5–15.5)
WBC: 10 10*3/uL (ref 4.0–10.5)

## 2012-05-25 NOTE — ED Notes (Signed)
Pt states he has felt lightheaded and has felt like his "blood sugar is high". Pt has not had his medication for DM x 5 months.

## 2012-05-25 NOTE — ED Provider Notes (Signed)
History     CSN: [id number]  Arrival date & time 05/25/12  0034   First MD Initiated Contact with Patient 05/25/12 0136      Chief Complaint  Patient presents with  . Hyperglycemia    (Consider location/radiation/quality/duration/timing/severity/associated sxs/prior treatment) HPI Comments: Francisco Carey is a 27 y.o. Male with a history of hypertension and diabetes presents to the emergency department reporting that he feels like "my blood sugar is high".  Patient states that he has not been using his insulin for the past month because he has been unable to afford it.  Patient denies any current symptoms including lightheadedness, dizziness, fatigue, fevers, night sweats, chills, nausea, vomiting, abdominal pain.  Patient is asymptomatic but states that he would like his sugar checked.  No other complaints this time.   The history is provided by the patient.    Past Medical History  Diagnosis Date  . Diabetes mellitus without complication   . Hypertension     Past Surgical History  Procedure Laterality Date  . Knee surgery    . Knee surgery      History reviewed. No pertinent family history.  History  Substance Use Topics  . Smoking status: Never Smoker   . Smokeless tobacco: Not on file  . Alcohol Use: No      Review of Systems  Constitutional: Negative for fever, chills and appetite change.  HENT: Negative for congestion.   Eyes: Negative for visual disturbance.  Respiratory: Negative for shortness of breath.   Cardiovascular: Negative for chest pain and leg swelling.  Gastrointestinal: Negative for abdominal pain.  Genitourinary: Negative for dysuria, urgency and frequency.  Neurological: Negative for dizziness, syncope, weakness, light-headedness, numbness and headaches.  Psychiatric/Behavioral: Negative for confusion.  All other systems reviewed and are negative.    Allergies  Review of patient's allergies indicates no known allergies.  Home  Medications   Current Outpatient Rx  Name  Route  Sig  Dispense  Refill  . aspirin EC 81 MG tablet   Oral   Take 81 mg by mouth daily.         . insulin detemir (LEVEMIR) 100 UNIT/ML injection   Subcutaneous   Inject 10 Units into the skin at bedtime.   3 mL   30   . LOSARTAN POTASSIUM PO   Oral   Take 1 tablet by mouth daily.           BP 147/91  Pulse 69  Temp(Src) 98.5 F (36.9 C) (Oral)  Resp 20  SpO2 100%  Physical Exam  Constitutional: He is oriented to person, place, and time. He appears well-developed and well-nourished. No distress.  HENT:  Head: Normocephalic and atraumatic.  Mouth/Throat: Oropharynx is clear and moist. No oropharyngeal exudate.  Eyes: Conjunctivae and EOM are normal. Pupils are equal, round, and reactive to light. No scleral icterus.  Neck: Normal range of motion. Neck supple. No tracheal deviation present. No thyromegaly present.  Cardiovascular: Normal rate, regular rhythm, normal heart sounds and intact distal pulses.   Pulmonary/Chest: Effort normal and breath sounds normal. No stridor. No respiratory distress. He has no wheezes.  Abdominal: Soft.  Musculoskeletal: Normal range of motion. He exhibits no edema and no tenderness.  Neurological: He is alert and oriented to person, place, and time. Coordination normal.  Skin: Skin is warm and dry. No rash noted. He is not diaphoretic. No erythema. No pallor.  Psychiatric: He has a normal mood and affect. His behavior is normal.  ED Course  Procedures (including critical care time)  Labs Reviewed  GLUCOSE, CAPILLARY - Abnormal; Notable for the following:    Glucose-Capillary 118 (*)    All other components within normal limits  CBC WITH DIFFERENTIAL  URINALYSIS, ROUTINE W REFLEX MICROSCOPIC  COMPREHENSIVE METABOLIC PANEL   No results found.   No diagnosis found.    MDM  Sugar check  Patient with history of diabetes and hypertension presented to the emergency department  for medication noncompliance times one month to 2 lack of funds.  Patient requests labs be checked because he "feels like he has high blood sugar".  Sugars normal while in the emergency department and no sign of acidosis.  Patient discharged with PCP followup.        Jaci Carrel, [place] 05/25/12 [phone]

## 2012-05-25 NOTE — ED Provider Notes (Signed)
Medical screening examination/treatment/procedure(s) were performed by non-physician practitioner and as supervising physician I was immediately available for consultation/collaboration.   Lyanne Co, MD 05/25/12 [phone]

## 2014-04-25 ENCOUNTER — Inpatient Hospital Stay
Admit: 2014-04-25 | Discharge: 2014-04-25 | Disposition: A | Discharge status: Home / Self Care | Payer: BLUE CROSS/BLUE SHIELD | Attending: Emergency Medicine

## 2014-04-25 ENCOUNTER — Emergency Department: Admit: 2014-04-25 | Payer: BLUE CROSS/BLUE SHIELD | Primary: Family Medicine

## 2014-04-25 DIAGNOSIS — R0789 Other chest pain: Secondary | ICD-10-CM

## 2014-04-25 MED ORDER — IBUPROFEN 400 MG TAB
400 mg | ORAL | Status: AC
Start: 2014-04-25 — End: 2014-04-25
  Administered 2014-04-25: 18:00:00 via ORAL

## 2014-04-25 MED ORDER — HYDROCODONE-ACETAMINOPHEN 5 MG-325 MG TAB
5-325 mg | ORAL_TABLET | Freq: Four times a day (QID) | ORAL | Status: AC | PRN
Start: 2014-04-25 — End: ?

## 2014-04-25 MED FILL — IBUPROFEN 400 MG TAB: 400 mg | ORAL | Qty: 1

## 2014-04-25 NOTE — ED Notes (Signed)
I have reviewed discharge instructions with the patient.  The patient verbalized understanding.

## 2014-04-25 NOTE — ED Provider Notes (Signed)
HPI Comments: 29 yo male, fell while [place] the [employer] [place] yesterday  Landed on his left chest wall  C/o pain in the chest, left clavicle, left shoulder    Worse with movement  Better with rest    No loc  No confusion    Patient is a 29 y.o. male presenting with fall.   Fall  Pertinent negatives include no fever.        Past Medical History:   Diagnosis Date   ??? Hypertension        Past Surgical History:   Procedure Laterality Date   ??? Hx orthopaedic           History reviewed. No pertinent family history.    History     Social History   ??? Marital Status: SINGLE     Spouse Name: N/A     Number of Children: N/A   ??? Years of Education: N/A     Occupational History   ??? Not on file.     Social History Main Topics   ??? Smoking status: Never Smoker    ??? Smokeless tobacco: Not on file   ??? Alcohol Use: Yes   ??? Drug Use: No   ??? Sexual Activity: Not on file     Other Topics Concern   ??? Not on file     Social History Narrative   ??? No narrative on file                ALLERGIES: Review of patient's allergies indicates no known allergies.      Review of Systems   Constitutional: Negative for fever.   Respiratory: Negative for shortness of breath.        Filed Vitals:    04/25/14 1134   BP: 159/95   Pulse: 90   Temp: 97.9 ??F (36.6 ??C)   Resp: 18   Height: 6\' 1"  (1.854 m)   Weight: 113.399 kg (250 lb)   SpO2: 98%            Physical Exam   Constitutional: He is oriented to person, place, and time. He appears well-developed and well-nourished.   HENT:   Head: Normocephalic and atraumatic.   Eyes: Pupils are equal, round, and reactive to light.   Cardiovascular: Normal rate and regular rhythm.    Pulmonary/Chest: Breath sounds normal. No respiratory distress.   Musculoskeletal:        Arms:  Neurological: He is alert and oriented to person, place, and time.   Skin: Skin is warm and dry.   Nursing note and vitals reviewed.       MDM  Number of Diagnoses or Management Options   Diagnosis management comments: 29 yo male with left chest/clavicle pain  After falling    XR -- no fx  XR CLAVICLE LT   Final Result    Impression:         1. 2 views left clavicle without comparison.        2. Alignment is anatomic. No acute fracture lucency. The acromioclavicular joint    appears preserved. Should there be adequate concern for acromioclavicular    separation, additional views with weights would be recommended.           Treat pain  Matt HolmesGregory R Lilton Pare, MD; 04/25/2014 @1 :46 PM  ===================================================================        Procedures

## 2014-04-25 NOTE — ED Notes (Signed)
Reports right shoulder and left neck/shoulder pain.  Reports fell off of the curb yesterday at the [employer] [place].

## 2017-05-04 ENCOUNTER — Encounter (HOSPITAL_COMMUNITY): Payer: Self-pay | Admitting: Emergency Medicine

## 2017-05-04 ENCOUNTER — Other Ambulatory Visit: Payer: Self-pay

## 2017-05-04 DIAGNOSIS — Z794 Long term (current) use of insulin: Secondary | ICD-10-CM | POA: Insufficient documentation

## 2017-05-04 DIAGNOSIS — E119 Type 2 diabetes mellitus without complications: Secondary | ICD-10-CM | POA: Insufficient documentation

## 2017-05-04 DIAGNOSIS — R112 Nausea with vomiting, unspecified: Secondary | ICD-10-CM | POA: Insufficient documentation

## 2017-05-04 DIAGNOSIS — Z79899 Other long term (current) drug therapy: Secondary | ICD-10-CM | POA: Insufficient documentation

## 2017-05-04 DIAGNOSIS — Z8673 Personal history of transient ischemic attack (TIA), and cerebral infarction without residual deficits: Secondary | ICD-10-CM | POA: Insufficient documentation

## 2017-05-04 DIAGNOSIS — R197 Diarrhea, unspecified: Secondary | ICD-10-CM | POA: Insufficient documentation

## 2017-05-04 DIAGNOSIS — R1010 Upper abdominal pain, unspecified: Secondary | ICD-10-CM | POA: Insufficient documentation

## 2017-05-04 DIAGNOSIS — I10 Essential (primary) hypertension: Secondary | ICD-10-CM | POA: Insufficient documentation

## 2017-05-04 DIAGNOSIS — Z7982 Long term (current) use of aspirin: Secondary | ICD-10-CM | POA: Insufficient documentation

## 2017-05-04 LAB — CBG MONITORING, ED: Glucose-Capillary: 319 mg/dL — ABNORMAL HIGH (ref 65–99)

## 2017-05-04 MED ORDER — ONDANSETRON 4 MG PO TBDP
4.0000 mg | ORAL_TABLET | Freq: Once | ORAL | Status: AC | PRN
  Administered 2017-05-05: 4 mg via ORAL
  Filled 2017-05-04: qty 1

## 2017-05-04 NOTE — ED Triage Notes (Signed)
Pt reports that he has been out of blood pressure and diabetes medication for the last two months and then this morning began having abd pain and has had 4 episodes of vomiting.

## 2017-05-05 ENCOUNTER — Emergency Department (HOSPITAL_COMMUNITY)
Admission: EM | Admit: 2017-05-05 | Discharge: 2017-05-05 | Disposition: A | Discharge status: Home / Self Care | Payer: Self-pay | Attending: Emergency Medicine | Admitting: Emergency Medicine

## 2017-05-05 DIAGNOSIS — R112 Nausea with vomiting, unspecified: Secondary | ICD-10-CM

## 2017-05-05 DIAGNOSIS — R197 Diarrhea, unspecified: Secondary | ICD-10-CM

## 2017-05-05 LAB — COMPREHENSIVE METABOLIC PANEL
ALK PHOS: 52 U/L (ref 38–126)
ALT: 26 U/L (ref 17–63)
AST: 20 U/L (ref 15–41)
Albumin: 4.2 g/dL (ref 3.5–5.0)
Anion gap: 10 (ref 5–15)
BILIRUBIN TOTAL: 0.7 mg/dL (ref 0.3–1.2)
BUN: 20 mg/dL (ref 6–20)
CALCIUM: 9.5 mg/dL (ref 8.9–10.3)
CHLORIDE: 97 mmol/L — AB (ref 101–111)
CO2: 27 mmol/L (ref 22–32)
CREATININE: 1.03 mg/dL (ref 0.61–1.24)
GFR calc Af Amer: >60 mL/min (ref >60)
Glucose, Bld: 356 mg/dL — ABNORMAL HIGH (ref 65–99)
Potassium: 4.4 mmol/L (ref 3.5–5.1)
Sodium: 134 mmol/L — ABNORMAL LOW (ref 135–145)
TOTAL PROTEIN: 7.9 g/dL (ref 6.5–8.1)

## 2017-05-05 LAB — CBC
HCT: 49.2 % (ref 39.0–52.0)
Hemoglobin: 17 g/dL (ref 13.0–17.0)
MCH: 28.8 pg (ref 26.0–34.0)
MCHC: 34.6 g/dL (ref 30.0–36.0)
MCV: 83.2 fL (ref 78.0–100.0)
PLATELETS: 337 10*3/uL (ref 150–400)
RBC: 5.91 MIL/uL — ABNORMAL HIGH (ref 4.22–5.81)
RDW: 12.9 % (ref 11.5–15.5)
WBC: 12.3 10*3/uL — AB (ref 4.0–10.5)

## 2017-05-05 LAB — URINALYSIS, ROUTINE W REFLEX MICROSCOPIC
Bilirubin Urine: NEGATIVE
Hgb urine dipstick: NEGATIVE
KETONES UR: 20 mg/dL — AB
LEUKOCYTES UA: NEGATIVE
NITRITE: NEGATIVE
PH: 5 (ref 5.0–8.0)
Protein, ur: NEGATIVE mg/dL
SPECIFIC GRAVITY, URINE: 1.02 (ref 1.005–1.030)

## 2017-05-05 LAB — CBG MONITORING, ED: Glucose-Capillary: 296 mg/dL — ABNORMAL HIGH (ref 65–99)

## 2017-05-05 LAB — LIPASE, BLOOD: LIPASE: 35 U/L (ref 11–51)

## 2017-05-05 MED ORDER — SODIUM CHLORIDE 0.9 % IV BOLUS (SEPSIS)
1000.0000 mL | Freq: Once | INTRAVENOUS | Status: AC
  Administered 2017-05-05: 1000 mL via INTRAVENOUS

## 2017-05-05 MED ORDER — METFORMIN HCL 500 MG PO TABS
1000.0000 mg | ORAL_TABLET | Freq: Once | ORAL | Status: AC
  Administered 2017-05-05: 1000 mg via ORAL
  Filled 2017-05-05: qty 2

## 2017-05-05 MED ORDER — INSULIN ASPART 100 UNIT/ML ~~LOC~~ SOLN
6.0000 [IU] | Freq: Once | SUBCUTANEOUS | Status: AC
  Administered 2017-05-05: 6 [IU] via INTRAVENOUS
  Filled 2017-05-05: qty 1

## 2017-05-05 MED ORDER — METFORMIN HCL 1000 MG PO TABS: 1000.0000 mg | ORAL_TABLET | Freq: Two times a day (BID) | ORAL | 2 refills | Status: DC

## 2017-05-05 MED ORDER — GLIPIZIDE 10 MG PO TABS: 10.0000 mg | ORAL_TABLET | Freq: Every day | ORAL | 2 refills | Status: DC

## 2017-05-05 MED ORDER — LISINOPRIL 10 MG PO TABS: 10.0000 mg | ORAL_TABLET | Freq: Every day | ORAL | 2 refills | Status: DC

## 2017-05-05 MED ORDER — PROMETHAZINE HCL 25 MG PO TABS: 25.0000 mg | ORAL_TABLET | Freq: Four times a day (QID) | ORAL | 0 refills | Status: DC | PRN

## 2017-05-05 NOTE — ED Provider Notes (Signed)
[facility]-EMERGENCY DEPT Provider Note   CSN: [id number] Arrival date & time: 05/04/17  2250     History   Chief Complaint Chief Complaint  Patient presents with  . Abdominal Pain    HPI Francisco Carey is a 32 y.o. male.  Patient presents to the ER for evaluation of abdominal pain with nausea, vomiting and diarrhea.  Symptoms began this evening.  He reports that he started having cramping across his upper abdomen, then became nauseated and vomited.  He had one soft stool.  After this his abdominal pain has resolved.  He is starting to feel better.  He complains of achy pain all over, has not documented any fevers.  Patient also reports that [place] recently moved from [place], has been out of his metformin and lisinopril.      Past Medical History:  Diagnosis Date  . Diabetes mellitus without complication (HCC)   . Hypertension     Patient Active Problem List   Diagnosis Date Noted  . DM (diabetes mellitus) (HCC) 02/16/2012  . HTN (hypertension) 02/16/2012  . Dyslipidemia 02/16/2012  . TIA (transient ischemic attack) 02/16/2012    Past Surgical History:  Procedure Laterality Date  . KNEE SURGERY    . KNEE SURGERY         Home Medications    Prior to Admission medications   Medication Sig Start Date End Date Taking? Authorizing Provider  aspirin EC 81 MG tablet Take 81 mg by mouth daily.    [provider]  glipiZIDE (GLUCOTROL) 10 MG tablet Take 1 tablet (10 mg total) by mouth daily before breakfast. 05/05/17   Pollina, Canary Brimhristopher J, MD  insulin detemir (LEVEMIR) 100 UNIT/ML injection Inject 10 Units into the skin at bedtime. 03/24/12   Eber HongMiller, Brian, MD  lisinopril (PRINIVIL,ZESTRIL) 10 MG tablet Take 1 tablet (10 mg total) by mouth daily. 05/05/17   Gilda CreasePollina, Christopher J, MD  LOSARTAN POTASSIUM PO Take 1 tablet by mouth daily.    [provider]  metFORMIN (GLUCOPHAGE) 1000 MG tablet Take 1 tablet (1,000 mg total) by  mouth 2 (two) times daily. 05/05/17   Gilda CreasePollina, Christopher J, MD  promethazine (PHENERGAN) 25 MG tablet Take 1 tablet (25 mg total) by mouth every 6 (six) hours as needed for nausea or vomiting. 05/05/17   Pollina, Canary Brimhristopher J, MD    Family History History reviewed. No pertinent family history.  Social History Social History   Tobacco Use  . Smoking status: Never Smoker  Substance Use Topics  . Alcohol use: No  . Drug use: No     Allergies   Patient has no known allergies.   Review of Systems Review of Systems  Gastrointestinal: Positive for abdominal pain, diarrhea, nausea and vomiting.  All other systems reviewed and are negative.    Physical Exam Updated Vital Signs BP (!) 153/109 (BP Location: Left Arm)   Pulse (!) 104   Temp 98.3 F (36.8 C) (Oral)   Resp 20   Ht 6\' 2"  (1.88 m)   Wt 113.4 kg (250 lb)   SpO2 100%   BMI 32.10 kg/m   Physical Exam  Constitutional: He is oriented to person, place, and time. He appears well-developed and well-nourished. No distress.  HENT:  Head: Normocephalic and atraumatic.  Right Ear: Hearing normal.  Left Ear: Hearing normal.  Nose: Nose normal.  Mouth/Throat: Oropharynx is clear and moist and mucous membranes are normal.  Eyes: Conjunctivae and EOM are normal. Pupils are equal,  round, and reactive to light.  Neck: Normal range of motion. Neck supple.  Cardiovascular: Regular rhythm, S1 normal and S2 normal. Exam reveals no gallop and no friction rub.  No murmur heard. Pulmonary/Chest: Effort normal and breath sounds normal. No respiratory distress. He exhibits no tenderness.  Abdominal: Soft. Normal appearance and bowel sounds are normal. There is no hepatosplenomegaly. There is no tenderness. There is no rebound, no guarding, no tenderness at McBurney's point and negative Murphy's sign. No hernia.  Musculoskeletal: Normal range of motion.  Neurological: He is alert and oriented to person, place, and time. He has normal  strength. No cranial nerve deficit or sensory deficit. Coordination normal. GCS eye subscore is 4. GCS verbal subscore is 5. GCS motor subscore is 6.  Skin: Skin is warm, dry and intact. No rash noted. No cyanosis.  Psychiatric: He has a normal mood and affect. His speech is normal and behavior is normal. Thought content normal.  Nursing note and vitals reviewed.    ED Treatments / Results  Labs (all labs ordered are listed, but only abnormal results are displayed) Labs Reviewed  COMPREHENSIVE METABOLIC PANEL - Abnormal; Notable for the following components:      Result Value   Sodium 134 (*)    Chloride 97 (*)    Glucose, Bld 356 (*)    All other components within normal limits  CBC - Abnormal; Notable for the following components:   WBC 12.3 (*)    RBC 5.91 (*)    All other components within normal limits  URINALYSIS, ROUTINE W REFLEX MICROSCOPIC - Abnormal; Notable for the following components:   Glucose, UA >=500 (*)    Ketones, ur 20 (*)    Bacteria, UA RARE (*)    Squamous Epithelial / LPF 0-5 (*)    All other components within normal limits  CBG MONITORING, ED - Abnormal; Notable for the following components:   Glucose-Capillary 319 (*)    All other components within normal limits  CBG MONITORING, ED - Abnormal; Notable for the following components:   Glucose-Capillary 296 (*)    All other components within normal limits  LIPASE, BLOOD    EKG  EKG Interpretation None       Radiology No results found.  Procedures Procedures (including critical care time)  Medications Ordered in ED Medications  insulin aspart (novoLOG) injection 6 Units (not administered)  metFORMIN (GLUCOPHAGE) tablet 1,000 mg (not administered)  ondansetron (ZOFRAN-ODT) disintegrating tablet 4 mg (4 mg Oral Given 05/05/17 0002)  sodium chloride 0.9 % bolus 1,000 mL (1,000 mLs Intravenous New Bag/Given 05/05/17 0128)    Followed by  sodium chloride 0.9 % bolus 1,000 mL (1,000 mLs  Intravenous New Bag/Given 05/05/17 0128)     Initial Impression / Assessment and Plan / ED Course  I have reviewed the triage vital signs and the nursing notes.  Pertinent labs & imaging results that were available during my care of the patient were reviewed by me and considered in my medical decision making (see chart for details).     Patient presents to the emergency department for evaluation of Abdominal pain with nausea, vomiting and diarrhea.  Symptoms began earlier today.  He has had multiple episodes of emesis, one episode of diarrhea.  He had abdominal pain and cramping earlier, this has resolved.  He has a benign, nontender abdominal exam.  He feels much improved with treatment here in the ER.  Repeat examination after fluids reveals that he is tolerating oral intake  without difficulty.  Patient has been off of his medications for 2 months.  Blood pressure was elevated as was his sugar.  Will reinitiate his lisinopril and Glucophage.  Final Clinical Impressions(s) / ED Diagnoses   Final diagnoses:  Nausea vomiting and diarrhea    ED Discharge Orders        Ordered    lisinopril (PRINIVIL,ZESTRIL) 10 MG tablet  Daily     05/05/17 0249    metFORMIN (GLUCOPHAGE) 1000 MG tablet  2 times daily     05/05/17 0249    glipiZIDE (GLUCOTROL) 10 MG tablet  Daily before breakfast     05/05/17 0249    promethazine (PHENERGAN) 25 MG tablet  Every 6 hours PRN     05/05/17 0249       Gilda Crease, MD 05/05/17 [phone]

## 2017-11-21 ENCOUNTER — Other Ambulatory Visit: Payer: Self-pay

## 2017-11-21 ENCOUNTER — Emergency Department (HOSPITAL_COMMUNITY): Payer: BLUE CROSS/BLUE SHIELD

## 2017-11-21 ENCOUNTER — Emergency Department (HOSPITAL_COMMUNITY)
Admission: EM | Admit: 2017-11-21 | Discharge: 2017-11-21 | Disposition: A | Discharge status: Home / Self Care | Payer: BLUE CROSS/BLUE SHIELD | Attending: Emergency Medicine | Admitting: Emergency Medicine

## 2017-11-21 ENCOUNTER — Encounter (HOSPITAL_COMMUNITY): Payer: Self-pay | Admitting: Emergency Medicine

## 2017-11-21 DIAGNOSIS — I10 Essential (primary) hypertension: Secondary | ICD-10-CM | POA: Insufficient documentation

## 2017-11-21 DIAGNOSIS — R202 Paresthesia of skin: Secondary | ICD-10-CM | POA: Diagnosis not present

## 2017-11-21 DIAGNOSIS — Z7982 Long term (current) use of aspirin: Secondary | ICD-10-CM | POA: Diagnosis not present

## 2017-11-21 DIAGNOSIS — E119 Type 2 diabetes mellitus without complications: Secondary | ICD-10-CM | POA: Diagnosis not present

## 2017-11-21 DIAGNOSIS — Z8673 Personal history of transient ischemic attack (TIA), and cerebral infarction without residual deficits: Secondary | ICD-10-CM | POA: Diagnosis not present

## 2017-11-21 DIAGNOSIS — Z7984 Long term (current) use of oral hypoglycemic drugs: Secondary | ICD-10-CM | POA: Insufficient documentation

## 2017-11-21 DIAGNOSIS — R200 Anesthesia of skin: Secondary | ICD-10-CM | POA: Diagnosis present

## 2017-11-21 LAB — CBC
HCT: 47.1 % (ref 39.0–52.0)
Hemoglobin: 15.8 g/dL (ref 13.0–17.0)
MCH: 28 pg (ref 26.0–34.0)
MCHC: 33.5 g/dL (ref 30.0–36.0)
MCV: 83.5 fL (ref 78.0–100.0)
PLATELETS: 333 10*3/uL (ref 150–400)
RBC: 5.64 MIL/uL (ref 4.22–5.81)
RDW: 13.2 % (ref 11.5–15.5)
WBC: 6.9 10*3/uL (ref 4.0–10.5)

## 2017-11-21 LAB — CBG MONITORING, ED: GLUCOSE-CAPILLARY: 185 mg/dL — AB (ref 70–99)

## 2017-11-21 LAB — BASIC METABOLIC PANEL
Anion gap: 11 (ref 5–15)
BUN: 18 mg/dL (ref 6–20)
CALCIUM: 9.4 mg/dL (ref 8.9–10.3)
CHLORIDE: 101 mmol/L (ref 98–111)
CO2: 26 mmol/L (ref 22–32)
CREATININE: 1.13 mg/dL (ref 0.61–1.24)
GFR calc Af Amer: >60 mL/min (ref >60)
GFR calc non Af Amer: >60 mL/min (ref >60)
GLUCOSE: 185 mg/dL — AB (ref 70–99)
Potassium: 4.4 mmol/L (ref 3.5–5.1)
Sodium: 138 mmol/L (ref 135–145)

## 2017-11-21 LAB — POCT I-STAT TROPONIN I: TROPONIN I, POC: 0 ng/mL (ref 0.00–0.08)

## 2017-11-21 MED ORDER — LISINOPRIL 10 MG PO TABS: 10.0000 mg | ORAL_TABLET | Freq: Every day | ORAL | 2 refills | Status: DC

## 2017-11-21 MED ORDER — GLIPIZIDE 10 MG PO TABS: 10.0000 mg | ORAL_TABLET | Freq: Every day | ORAL | 2 refills | Status: DC

## 2017-11-21 MED ORDER — INSULIN DETEMIR 100 UNIT/ML ~~LOC~~ SOLN: 10.0000 [IU] | Freq: Every day | SUBCUTANEOUS | 30 refills | Status: DC

## 2017-11-21 MED ORDER — METFORMIN HCL 1000 MG PO TABS: 1000.0000 mg | ORAL_TABLET | Freq: Two times a day (BID) | ORAL | 2 refills | Status: DC

## 2017-11-21 NOTE — ED Triage Notes (Signed)
Patient complaining of numbness and tingling up the left arm. Patient has been out blood pressure medication and diabetic medication for about month. Patient states he just got insurance.

## 2017-11-21 NOTE — Discharge Instructions (Addendum)
I have reordered all the medications, please follow up with PCP in 1 week. If your symptoms worsen or you experience any chest pain, or shortness of breath please return to the ED.

## 2017-11-21 NOTE — ED Provider Notes (Signed)
[facility]-EMERGENCY DEPT Provider Note   CSN: [id number] Arrival date & time: 11/21/17  1957     History   Chief Complaint Chief Complaint  Patient presents with  . Hypertension  . Numbness    HPI Francisco Carey is a 32 y.o. male.  32 y.o male with a PMH pf HTN and DM presents to the ED with a chief complaint of left arms pain that began 3 hours ago.Patient states he first noticed the pain at work, he reports the tingling along his left elbow radiating to his hand. Patient took ibuprofen which helped with the tingling. He states he has been out of of metformin and lisinopril x2 months.  Patient states he had no insurance but recently got insurance.  He called to schedule an appointment with the [facility] but was told that he could not be seen for another 3 weeks.  Patient states he took his last glipizide today.  He denies any chest pain, shortness of breath, abdominal pain, or urinary complaints.     Past Medical History:  Diagnosis Date  . Diabetes mellitus without complication (HCC)   . Hypertension     Patient Active Problem List   Diagnosis Date Noted  . DM (diabetes mellitus) (HCC) 02/16/2012  . HTN (hypertension) 02/16/2012  . Dyslipidemia 02/16/2012  . TIA (transient ischemic attack) 02/16/2012    Past Surgical History:  Procedure Laterality Date  . KNEE SURGERY    . KNEE SURGERY          Home Medications    Prior to Admission medications   Medication Sig Start Date End Date Taking? Authorizing Provider  aspirin EC 81 MG tablet Take 81 mg by mouth daily.   Yes [provider]  glipiZIDE (GLUCOTROL) 10 MG tablet Take 1 tablet (10 mg total) by mouth daily before breakfast. 05/05/17  Yes Pollina, Canary Brimhristopher J, MD  insulin detemir (LEVEMIR) 100 UNIT/ML injection Inject 10 Units into the skin at bedtime. 03/24/12   Eber HongMiller, Brian, MD  lisinopril (PRINIVIL,ZESTRIL) 10 MG tablet Take 1 tablet (10 mg total) by mouth  daily. 05/05/17   Gilda CreasePollina, Christopher J, MD  metFORMIN (GLUCOPHAGE) 1000 MG tablet Take 1 tablet (1,000 mg total) by mouth 2 (two) times daily. 05/05/17   Gilda CreasePollina, Christopher J, MD    Family History History reviewed. No pertinent family history.  Social History Social History   Tobacco Use  . Smoking status: Never Smoker  . Smokeless tobacco: Never Used  Substance Use Topics  . Alcohol use: No  . Drug use: No     Allergies   Patient has no known allergies.   Review of Systems Review of Systems  Constitutional: Negative for chills and fever.  HENT: Negative for ear pain and sore throat.   Eyes: Negative for pain and visual disturbance.  Respiratory: Negative for cough and shortness of breath.   Cardiovascular: Negative for chest pain and palpitations.  Gastrointestinal: Negative for abdominal pain and vomiting.  Genitourinary: Negative for dysuria and hematuria.  Musculoskeletal: Positive for arthralgias and myalgias. Negative for back pain.  Skin: Negative for color change and rash.  Neurological: Negative for seizures and syncope.  All other systems reviewed and are negative.    Physical Exam Updated Vital Signs BP (!) 158/95 (BP Location: Right Arm)   Pulse (!) 101   Temp 98.5 F (36.9 C) (Oral)   Resp 18   Ht 6\' 1"  (1.854 m)   Wt 113.4 kg   SpO2  100%   BMI 32.98 kg/m   Physical Exam  Constitutional: He is oriented to person, place, and time. He appears well-developed and well-nourished.  HENT:  Head: Normocephalic and atraumatic.  Eyes: Pupils are equal, round, and reactive to light.  Neck: Normal range of motion. Neck supple.  Cardiovascular: Normal heart sounds.  Pulmonary/Chest: Effort normal and breath sounds normal. He has no wheezes.  Abdominal: Soft. Bowel sounds are normal. There is no tenderness.  Musculoskeletal: He exhibits no tenderness or deformity.  Neurological: He is alert and oriented to person, place, and time.  Skin: Skin is warm  and dry.  Nursing note and vitals reviewed.    ED Treatments / Results  Labs (all labs ordered are listed, but only abnormal results are displayed) Labs Reviewed  BASIC METABOLIC PANEL - Abnormal; Notable for the following components:      Result Value   Glucose, Bld 185 (*)    All other components within normal limits  CBG MONITORING, ED - Abnormal; Notable for the following components:   Glucose-Capillary 185 (*)    All other components within normal limits  CBC  I-STAT TROPONIN, ED  POCT I-STAT TROPONIN I    EKG EKG Interpretation  Date/Time:  Friday November 21 2017 20:40:07 EDT Ventricular Rate:  94 PR Interval:    QRS Duration: 74 QT Interval:  353 QTC Calculation: 442 R Axis:   44 Text Interpretation:  Sinus rhythm Baseline wander in lead(s) V4 Confirmed by Virgina NorfolkAdam, Curatolo [phone]([id number]) on 11/21/2017 10:10:33 PM   Radiology Dg Chest 2 View  Result Date: 11/21/2017 CLINICAL DATA:  Left arm pain and tingling since this morning. EXAM: CHEST - 2 VIEW COMPARISON:  01/29/2010. FINDINGS: The heart size and mediastinal contours are within normal limits. Both lungs are clear. The visualized skeletal structures are unremarkable. IMPRESSION: Normal examination. Electronically Signed   By: Beckie SaltsSteven  Reid M.D.   On: 11/21/2017 20:55    Procedures Procedures (including critical care time)  Medications Ordered in ED Medications - No data to display   Initial Impression / Assessment and Plan / ED Course  I have reviewed the triage vital signs and the nursing notes.  Pertinent labs & imaging results that were available during my care of the patient were reviewed by me and considered in my medical decision making (see chart for details).     Patient presents with left arm numbness which improved with ibuprofen.He reports he has been out of metformin and lisinopril x 2 months. Patient states he took his last glipizide this morning.   CBG was 185 BMP showed no electrolyte abnormality,  CBC showed no leukocytosis. DG Chest was negative for acute abnormality such as pneumothorax or pneumonia. Patient denies any chest pain. His BP while in the ED is 158/95 patient creatine level is normal, EKG is normal and DG chest was normal.Patient is asymptomatic at this time.Vitals are stable.I will re order patient's medications as he cannot see his PCP for another 3 weeks. Return precautions provided.   Final Clinical Impressions(s) / ED Diagnoses   Final diagnoses:  Paresthesia    ED Discharge Orders    None       Claude MangesSoto, Mychael Soots, PA-C 11/21/17 2234    Virgina Norfolkuratolo, Adam, DO 11/22/17 [phone]

## 2018-05-25 ENCOUNTER — Ambulatory Visit: Payer: Self-pay | Admitting: Family Medicine

## 2019-02-10 IMAGING — CR DG CHEST 2V
2 series · 2 of 2 positions shown · non-contrast
Comparison: 01/29/2010.

CLINICAL DATA: Left arm pain and tingling since this morning.

EXAM:
CHEST - 2 VIEW

[w chest pa]
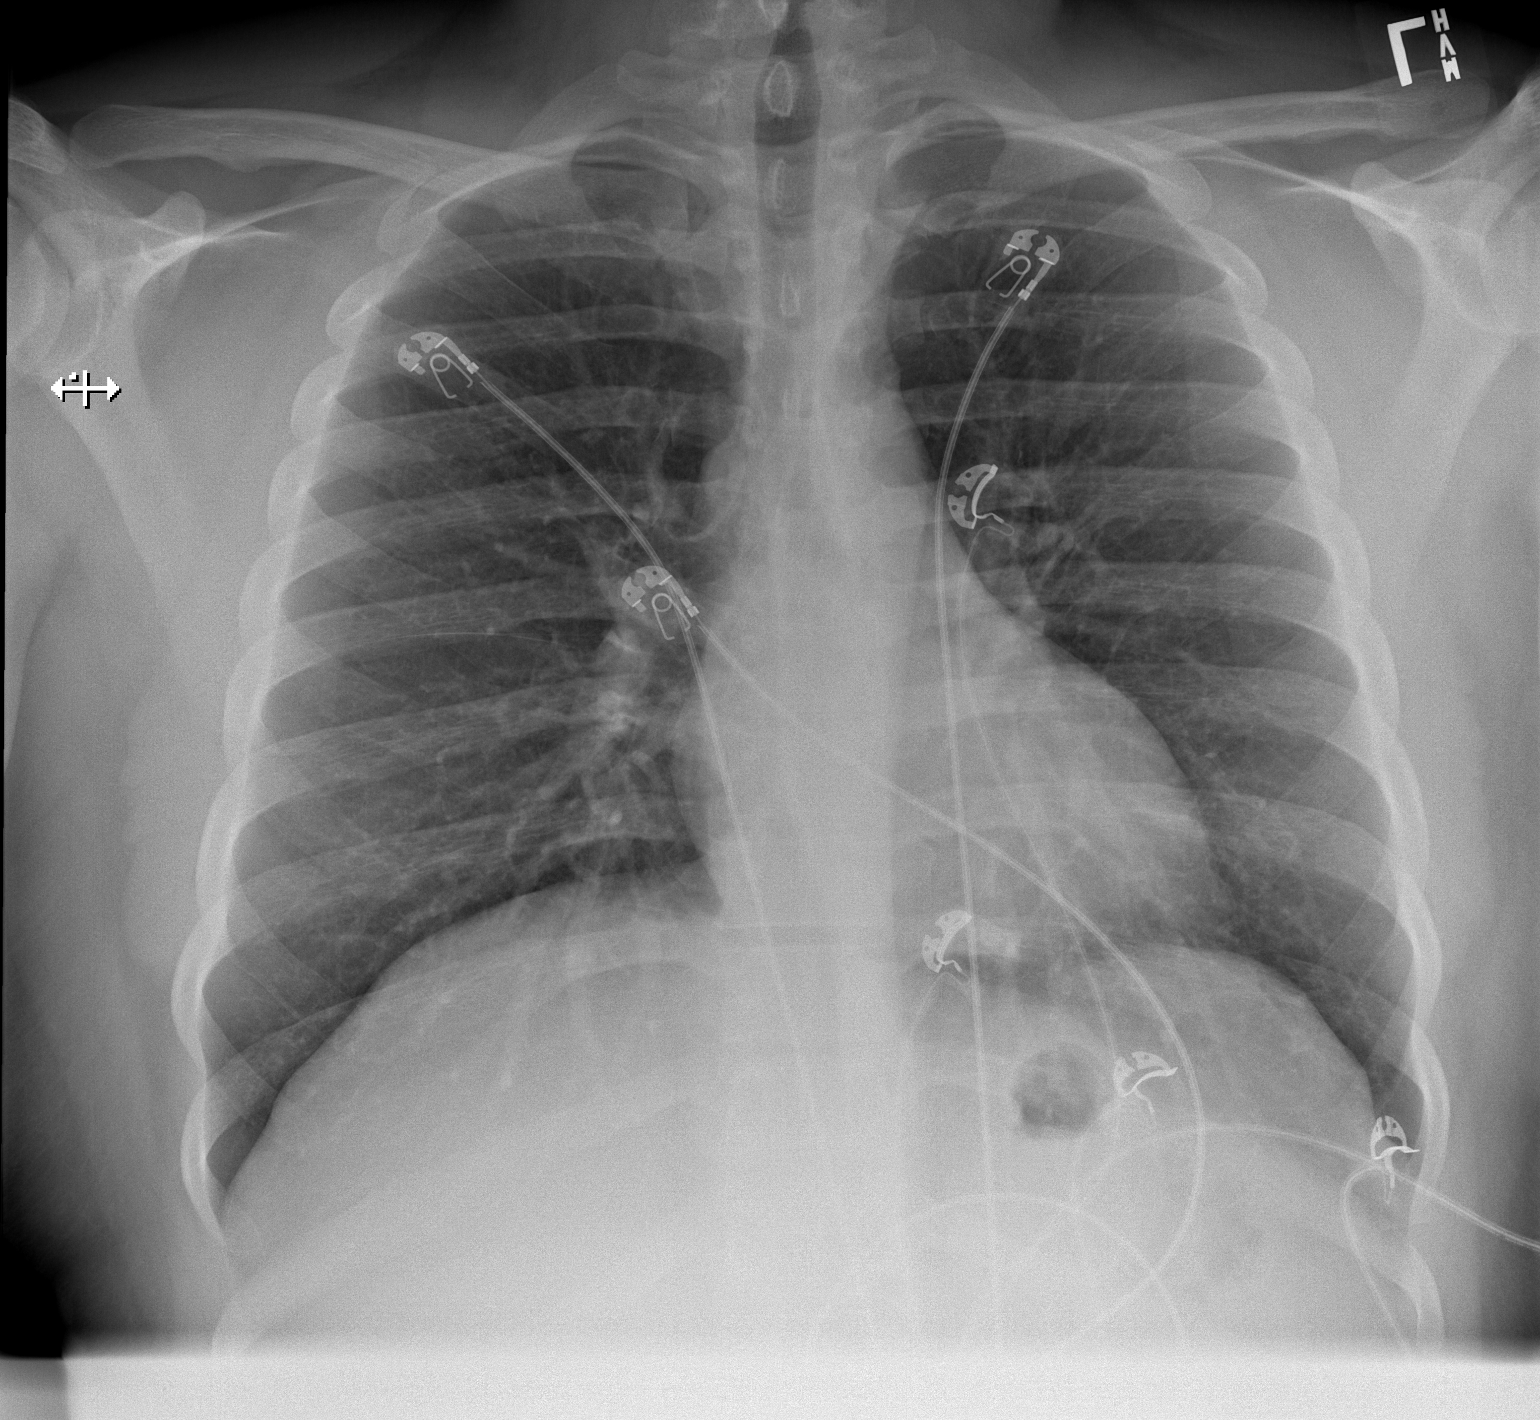

[w chest lat]
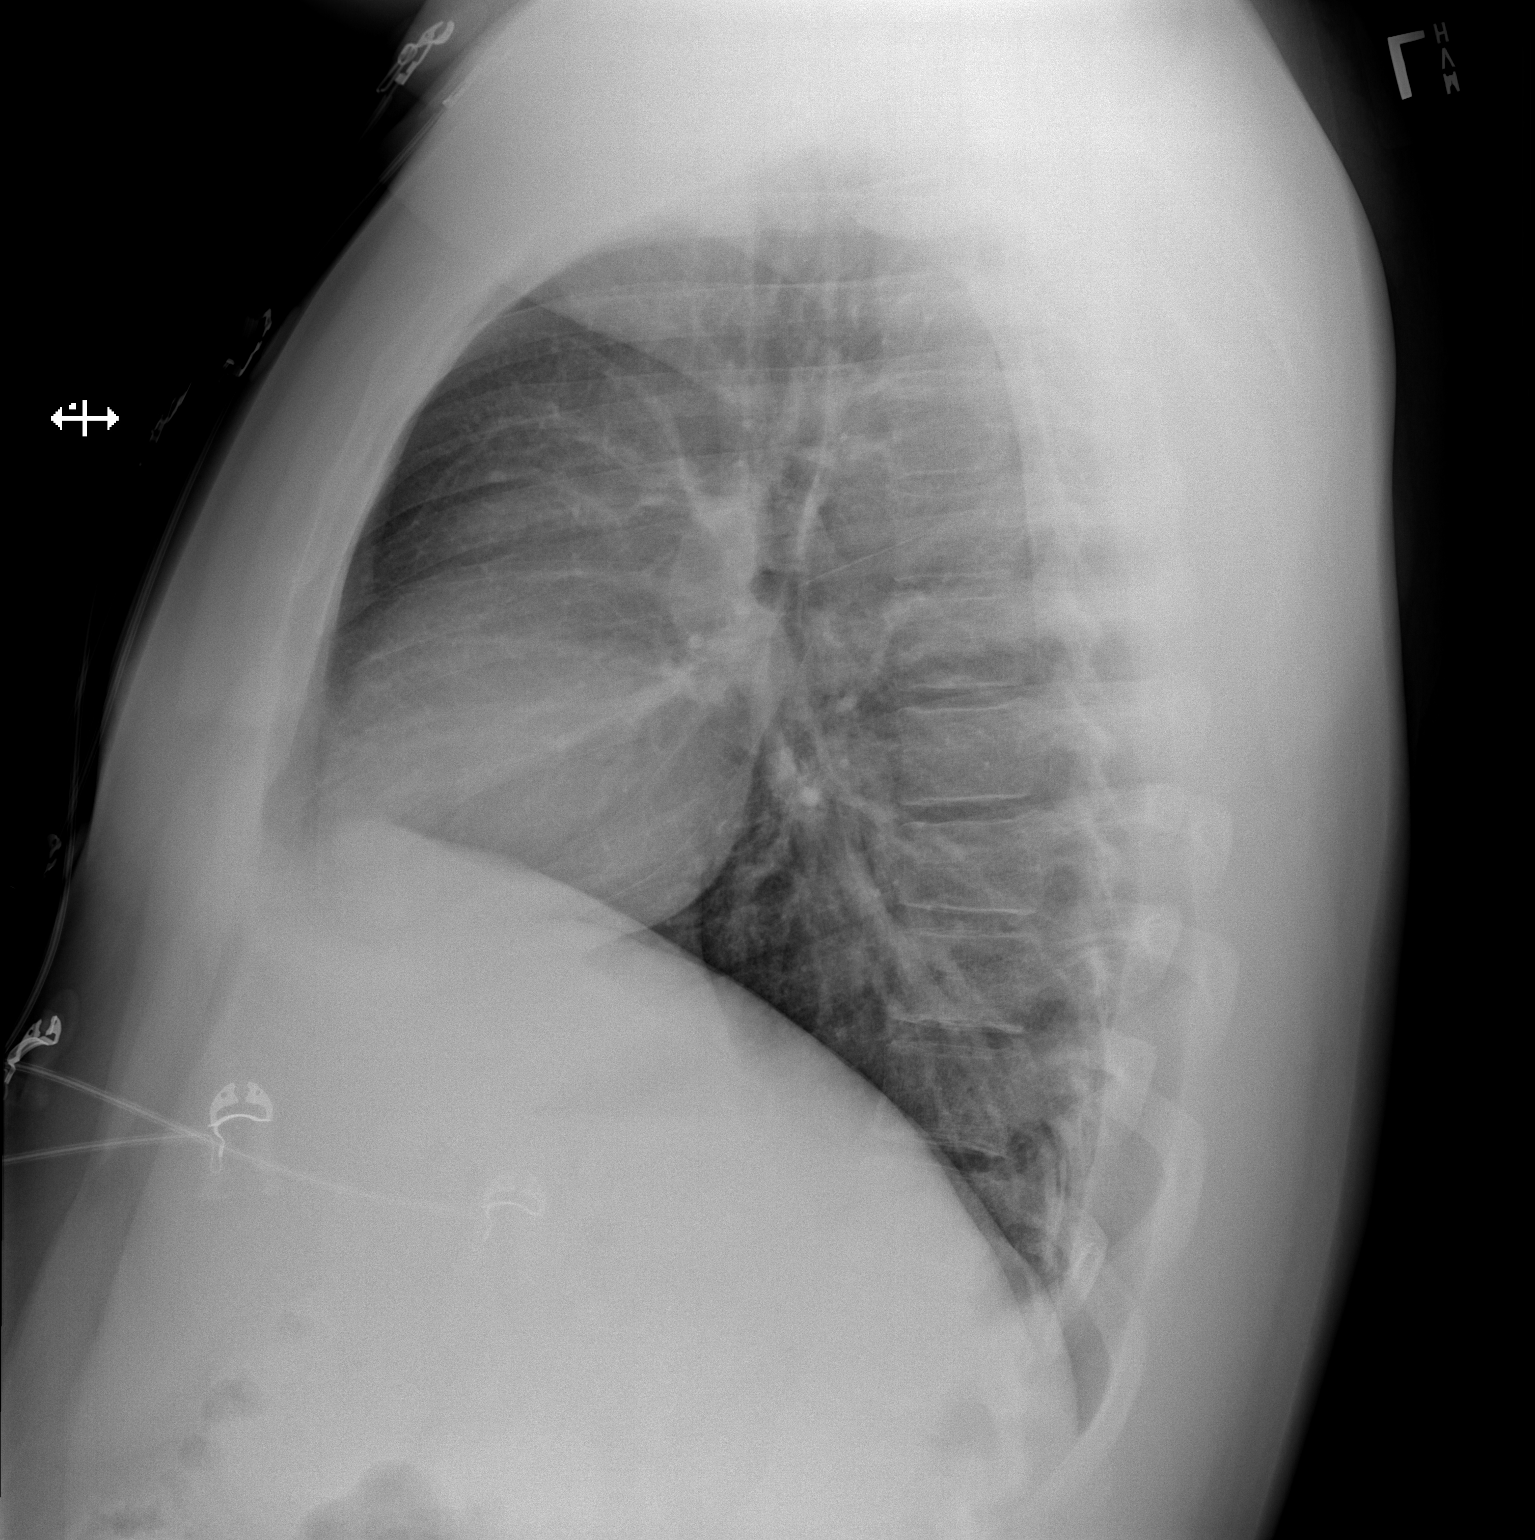

[2 of 2 positions shown; findings below may reference images not displayed]

FINDINGS: The heart size and mediastinal contours are within normal limits.
Both lungs are clear. The visualized skeletal structures are
unremarkable.
IMPRESSION: Normal examination.

## 2019-03-18 ENCOUNTER — Other Ambulatory Visit: Payer: Self-pay

## 2019-03-18 DIAGNOSIS — Z20822 Contact with and (suspected) exposure to covid-19: Secondary | ICD-10-CM

## 2019-03-20 LAB — NOVEL CORONAVIRUS, NAA: SARS-CoV-2, NAA: NOT DETECTED

## 2019-06-23 ENCOUNTER — Ambulatory Visit: Payer: Self-pay | Attending: Internal Medicine

## 2019-06-23 DIAGNOSIS — Z20822 Contact with and (suspected) exposure to covid-19: Secondary | ICD-10-CM

## 2019-06-24 LAB — NOVEL CORONAVIRUS, NAA: SARS-CoV-2, NAA: NOT DETECTED

## 2020-01-19 ENCOUNTER — Emergency Department (HOSPITAL_COMMUNITY)
Admission: EM | Admit: 2020-01-19 | Discharge: 2020-01-19 | Disposition: A | Discharge status: Home / Self Care | Payer: Self-pay | Attending: Emergency Medicine | Admitting: Emergency Medicine

## 2020-01-19 ENCOUNTER — Other Ambulatory Visit: Payer: Self-pay

## 2020-01-19 DIAGNOSIS — I10 Essential (primary) hypertension: Secondary | ICD-10-CM | POA: Insufficient documentation

## 2020-01-19 DIAGNOSIS — Z7984 Long term (current) use of oral hypoglycemic drugs: Secondary | ICD-10-CM | POA: Insufficient documentation

## 2020-01-19 DIAGNOSIS — Z79899 Other long term (current) drug therapy: Secondary | ICD-10-CM | POA: Insufficient documentation

## 2020-01-19 DIAGNOSIS — Z760 Encounter for issue of repeat prescription: Secondary | ICD-10-CM | POA: Insufficient documentation

## 2020-01-19 DIAGNOSIS — Z7982 Long term (current) use of aspirin: Secondary | ICD-10-CM | POA: Insufficient documentation

## 2020-01-19 DIAGNOSIS — Z794 Long term (current) use of insulin: Secondary | ICD-10-CM | POA: Insufficient documentation

## 2020-01-19 DIAGNOSIS — E119 Type 2 diabetes mellitus without complications: Secondary | ICD-10-CM | POA: Insufficient documentation

## 2020-01-19 MED ORDER — SILDENAFIL CITRATE 50 MG PO TABS: 50.0000 mg | ORAL_TABLET | Freq: Every day | ORAL | 0 refills | Status: DC

## 2020-01-19 MED ORDER — GLIPIZIDE 5 MG PO TABS: 5.0000 mg | ORAL_TABLET | Freq: Two times a day (BID) | ORAL | 0 refills | Status: DC

## 2020-01-19 MED ORDER — GLIPIZIDE 10 MG PO TABS: 10.0000 mg | ORAL_TABLET | Freq: Every day | ORAL | 0 refills | Status: DC

## 2020-01-19 NOTE — Discharge Instructions (Addendum)
It is important for you to establish care with a primary care provider for further refills. Return to the ER if you start to experience increased thirst or urination, chest pain or shortness of breath.

## 2020-01-19 NOTE — ED Triage Notes (Signed)
Pt denies complaints, sts he is going through insurance approval at his new job and needs some of his medications refilled. Cannot see PCP until two weeks from now.

## 2020-01-19 NOTE — ED Provider Notes (Signed)
[facility] EMERGENCY DEPARTMENT Provider Note   CSN: [id number] Arrival date & time: 01/19/20  1209     History Chief Complaint  Patient presents with  . Medication Refill    Francisco Carey is a 34 y.o. male with a past medical history of diabetes, hypertension presenting to the ED requesting refill of his medications.  States that he ran out of his glipizide about a year ago.  Ran out of his sildenafil as well. He is trying to obtain health insurance so that he could see his PCP although the earliest available appointment is 2 weeks from now.  He denies any symptoms.   HPI     Past Medical History:  Diagnosis Date  . Diabetes mellitus without complication (HCC)   . Hypertension     Patient Active Problem List   Diagnosis Date Noted  . DM (diabetes mellitus) (HCC) 02/16/2012  . HTN (hypertension) 02/16/2012  . Dyslipidemia 02/16/2012  . TIA (transient ischemic attack) 02/16/2012    Past Surgical History:  Procedure Laterality Date  . KNEE SURGERY    . KNEE SURGERY         No family history on file.  Social History   Tobacco Use  . Smoking status: Never Smoker  . Smokeless tobacco: Never Used  Vaping Use  . Vaping Use: Never used  Substance Use Topics  . Alcohol use: No  . Drug use: No    Home Medications Prior to Admission medications   Medication Sig Start Date End Date Taking? Authorizing Provider  lisinopril (PRINIVIL,ZESTRIL) 10 MG tablet Take 1 tablet (10 mg total) by mouth daily. 11/21/17  Yes Soto, Leonie Douglas, PA-C  metFORMIN (GLUCOPHAGE) 1000 MG tablet Take 1 tablet (1,000 mg total) by mouth 2 (two) times daily. 11/21/17  Yes Claude Manges, PA-C  aspirin EC 81 MG tablet Take 81 mg by mouth daily.    [provider]  glipiZIDE (GLUCOTROL) 5 MG tablet Take 1 tablet (5 mg total) by mouth in the morning and at bedtime. 01/19/20   Armonie Staten, PA-C  insulin detemir (LEVEMIR) 100 UNIT/ML injection Inject 0.1 mLs (10 Units  total) into the skin at bedtime. Patient not taking: Reported on 01/19/2020 11/21/17   Claude Manges, PA-C  sildenafil (VIAGRA) 50 MG tablet Take 1 tablet (50 mg total) by mouth daily. For 15 days prn 01/19/20   Dietrich Pates, PA-C    Allergies    Patient has no known allergies.  Review of Systems   Review of Systems  Constitutional: Negative for chills and fever.  Cardiovascular: Negative for chest pain.  Gastrointestinal: Negative for abdominal pain.    Physical Exam Updated Vital Signs BP (!) 147/88 (BP Location: Right Arm)   Pulse 89   Temp 98.6 F (37 C) (Oral)   Resp 14   SpO2 100%   Physical Exam Vitals and nursing note reviewed.  Constitutional:      General: He is not in acute distress.    Appearance: He is well-developed. He is not diaphoretic.  HENT:     Head: Normocephalic and atraumatic.  Eyes:     General: No scleral icterus.    Conjunctiva/sclera: Conjunctivae normal.  Pulmonary:     Effort: Pulmonary effort is normal. No respiratory distress.  Musculoskeletal:     Cervical back: Normal range of motion.  Skin:    Findings: No rash.  Neurological:     Mental Status: He is alert.     ED Results / Procedures /  Treatments   Labs (all labs ordered are listed, but only abnormal results are displayed) Labs Reviewed - No data to display  EKG None  Radiology No results found.  Procedures Procedures (including critical care time)  Medications Ordered in ED Medications - No data to display  ED Course  I have reviewed the triage vital signs and the nursing notes.  Pertinent labs & imaging results that were available during my care of the patient were reviewed by me and considered in my medical decision making (see chart for details).    MDM Rules/Calculators/A&P                          34 year old male presenting to the ED requesting refill of his glipizide and sildenafil.  He has been out for a year.  He denies any complaints.  We will provide  refills and have him follow-up with PCP.   Patient is hemodynamically stable, in NAD, and able to ambulate in the ED. Evaluation does not show pathology that would require ongoing emergent intervention or inpatient treatment. I explained the diagnosis to the patient. Pain has been managed and has no complaints prior to discharge. Patient is comfortable with above plan and is stable for discharge at this time. All questions were answered prior to disposition. Strict return precautions for returning to the ED were discussed. Encouraged follow up with PCP.   An After Visit Summary was printed and given to the patient.   Portions of this note were generated with Scientist, clinical (histocompatibility and immunogenetics). Dictation errors may occur despite best attempts at proofreading.  Final Clinical Impression(s) / ED Diagnoses Final diagnoses:  Encounter for medication refill    Rx / DC Orders ED Discharge Orders         Ordered    glipiZIDE (GLUCOTROL) 10 MG tablet  Daily before breakfast,   Status:  Discontinued        01/19/20 1339    glipiZIDE (GLUCOTROL) 5 MG tablet  2 times daily        01/19/20 1401    sildenafil (VIAGRA) 50 MG tablet  Daily        01/19/20 1401           Dietrich Pates, PA-C 01/19/20 1402    Melene Plan, DO 01/19/20 1403

## 2020-04-05 ENCOUNTER — Other Ambulatory Visit: Payer: Self-pay

## 2020-04-05 ENCOUNTER — Emergency Department (HOSPITAL_COMMUNITY)
Admission: EM | Admit: 2020-04-05 | Discharge: 2020-04-05 | Disposition: A | Discharge status: Home / Self Care | Payer: Self-pay | Attending: Emergency Medicine | Admitting: Emergency Medicine

## 2020-04-05 ENCOUNTER — Encounter (HOSPITAL_COMMUNITY): Payer: Self-pay | Admitting: Emergency Medicine

## 2020-04-05 DIAGNOSIS — U071 COVID-19: Secondary | ICD-10-CM | POA: Insufficient documentation

## 2020-04-05 DIAGNOSIS — Z5321 Procedure and treatment not carried out due to patient leaving prior to being seen by health care provider: Secondary | ICD-10-CM | POA: Insufficient documentation

## 2020-04-05 LAB — CBG MONITORING, ED: Glucose-Capillary: 186 mg/dL — ABNORMAL HIGH (ref 70–99)

## 2020-04-05 LAB — RESP PANEL BY RT-PCR (FLU A&B, COVID) ARPGX2
Influenza A by PCR: NEGATIVE
Influenza B by PCR: NEGATIVE
SARS Coronavirus 2 by RT PCR: POSITIVE — AB

## 2020-04-05 NOTE — ED Triage Notes (Signed)
Pt here from home with a covid exposure from a work [employer] , pt with c/o weakness and some slight sob

## 2020-11-01 ENCOUNTER — Encounter (HOSPITAL_COMMUNITY): Payer: Self-pay | Admitting: Emergency Medicine

## 2020-11-01 ENCOUNTER — Emergency Department (HOSPITAL_COMMUNITY)
Admission: EM | Admit: 2020-11-01 | Discharge: 2020-11-01 | Disposition: A | Discharge status: Home / Self Care | Payer: Self-pay | Attending: Emergency Medicine | Admitting: Emergency Medicine

## 2020-11-01 DIAGNOSIS — Z7984 Long term (current) use of oral hypoglycemic drugs: Secondary | ICD-10-CM | POA: Insufficient documentation

## 2020-11-01 DIAGNOSIS — Z794 Long term (current) use of insulin: Secondary | ICD-10-CM | POA: Insufficient documentation

## 2020-11-01 DIAGNOSIS — I10 Essential (primary) hypertension: Secondary | ICD-10-CM | POA: Insufficient documentation

## 2020-11-01 DIAGNOSIS — Z7982 Long term (current) use of aspirin: Secondary | ICD-10-CM | POA: Insufficient documentation

## 2020-11-01 DIAGNOSIS — E119 Type 2 diabetes mellitus without complications: Secondary | ICD-10-CM | POA: Insufficient documentation

## 2020-11-01 DIAGNOSIS — R42 Dizziness and giddiness: Secondary | ICD-10-CM | POA: Insufficient documentation

## 2020-11-01 DIAGNOSIS — Z79899 Other long term (current) drug therapy: Secondary | ICD-10-CM | POA: Insufficient documentation

## 2020-11-01 DIAGNOSIS — Z760 Encounter for issue of repeat prescription: Secondary | ICD-10-CM

## 2020-11-01 LAB — COMPREHENSIVE METABOLIC PANEL
ALT: 23 U/L (ref 0–44)
AST: 22 U/L (ref 15–41)
Albumin: 3.8 g/dL (ref 3.5–5.0)
Alkaline Phosphatase: 43 U/L (ref 38–126)
Anion gap: 6 (ref 5–15)
BUN: 16 mg/dL (ref 6–20)
CO2: 25 mmol/L (ref 22–32)
Calcium: 9.5 mg/dL (ref 8.9–10.3)
Chloride: 105 mmol/L (ref 98–111)
Creatinine, Ser: 1.04 mg/dL (ref 0.61–1.24)
GFR, Estimated: >60 mL/min (ref >60)
Glucose, Bld: 174 mg/dL — ABNORMAL HIGH (ref 70–99)
Potassium: 4.6 mmol/L (ref 3.5–5.1)
Sodium: 136 mmol/L (ref 135–145)
Total Bilirubin: 0.9 mg/dL (ref 0.3–1.2)
Total Protein: 6.9 g/dL (ref 6.5–8.1)

## 2020-11-01 LAB — CBC WITH DIFFERENTIAL/PLATELET
Abs Immature Granulocytes: 0.03 10*3/uL (ref 0.00–0.07)
Basophils Absolute: 0 10*3/uL (ref 0.0–0.1)
Basophils Relative: 1 %
Eosinophils Absolute: 0.2 10*3/uL (ref 0.0–0.5)
Eosinophils Relative: 3 %
HCT: 44.4 % (ref 39.0–52.0)
Hemoglobin: 14.2 g/dL (ref 13.0–17.0)
Immature Granulocytes: 1 %
Lymphocytes Relative: 38 %
Lymphs Abs: 2.5 10*3/uL (ref 0.7–4.0)
MCH: 27.9 pg (ref 26.0–34.0)
MCHC: 32 g/dL (ref 30.0–36.0)
MCV: 87.2 fL (ref 80.0–100.0)
Monocytes Absolute: 0.7 10*3/uL (ref 0.1–1.0)
Monocytes Relative: 11 %
Neutro Abs: 3.1 10*3/uL (ref 1.7–7.7)
Neutrophils Relative %: 46 %
Platelets: 309 10*3/uL (ref 150–400)
RBC: 5.09 MIL/uL (ref 4.22–5.81)
RDW: 13.7 % (ref 11.5–15.5)
WBC: 6.6 10*3/uL (ref 4.0–10.5)
nRBC: 0 % (ref 0.0–0.2)

## 2020-11-01 LAB — CBG MONITORING, ED: Glucose-Capillary: 174 mg/dL — ABNORMAL HIGH (ref 70–99)

## 2020-11-01 MED ORDER — METFORMIN HCL 1000 MG PO TABS: 1000.0000 mg | ORAL_TABLET | Freq: Two times a day (BID) | ORAL | 2 refills | Status: DC

## 2020-11-01 MED ORDER — GLIPIZIDE 5 MG PO TABS: 5.0000 mg | ORAL_TABLET | Freq: Two times a day (BID) | ORAL | 0 refills | Status: DC

## 2020-11-01 NOTE — ED Provider Notes (Signed)
[facility] EMERGENCY DEPARTMENT Provider Note   CSN: [id number] Arrival date & time: 11/01/20  4818     History Chief Complaint  Patient presents with   Medication Refill    Francisco Carey is a 35 y.o. male.  HPI  Patient presents for medication refill.  He has a history of diabetes, is usually prescribed glipizide and metformin.  Has not been taking for the last 3 to 4 months.  States she has been feeling increasing hunger and thirst throughout the day at work.  No nausea or vomiting, no headaches or vision changes.  He has been lightheaded in the heat, but does not believe this is due to his diabetes.  Is concerned that because he has not been taking his medicine the polydipsia and polyuria are due to increased sugar levels.  Past Medical History:  Diagnosis Date   Diabetes mellitus without complication (HCC)    Hypertension     Patient Active Problem List   Diagnosis Date Noted   DM (diabetes mellitus) (HCC) 02/16/2012   HTN (hypertension) 02/16/2012   Dyslipidemia 02/16/2012   TIA (transient ischemic attack) 02/16/2012    Past Surgical History:  Procedure Laterality Date   KNEE SURGERY     KNEE SURGERY         No family history on file.  Social History   Tobacco Use   Smoking status: Never   Smokeless tobacco: Never  Vaping Use   Vaping Use: Never used  Substance Use Topics   Alcohol use: No   Drug use: No    Home Medications Prior to Admission medications   Medication Sig Start Date End Date Taking? Authorizing Provider  aspirin EC 81 MG tablet Take 81 mg by mouth daily.    [provider]  glipiZIDE (GLUCOTROL) 5 MG tablet Take 1 tablet (5 mg total) by mouth in the morning and at bedtime. 01/19/20   Khatri, Hina, PA-C  insulin detemir (LEVEMIR) 100 UNIT/ML injection Inject 0.1 mLs (10 Units total) into the skin at bedtime. Patient not taking: Reported on 01/19/2020 11/21/17   Claude Manges, PA-C  lisinopril  (PRINIVIL,ZESTRIL) 10 MG tablet Take 1 tablet (10 mg total) by mouth daily. 11/21/17   Claude Manges, PA-C  metFORMIN (GLUCOPHAGE) 1000 MG tablet Take 1 tablet (1,000 mg total) by mouth 2 (two) times daily. 11/21/17   Claude Manges, PA-C  sildenafil (VIAGRA) 50 MG tablet Take 1 tablet (50 mg total) by mouth daily. For 15 days prn 01/19/20   Dietrich Pates, PA-C    Allergies    Patient has no known allergies.  Review of Systems   Review of Systems  Constitutional:  Negative for fever.  Eyes:  Negative for visual disturbance.  Respiratory:  Negative for shortness of breath.   Cardiovascular:  Negative for chest pain.  Endocrine: Positive for polydipsia and polyuria.  Neurological:  Negative for headaches.   Physical Exam Updated Vital Signs BP (!) 143/91   Pulse 87   Temp 97.8 F (36.6 C)   Resp 16   Ht 6\' 2"  (1.88 m)   Wt 95.3 kg   SpO2 99%   BMI 26.96 kg/m   Physical Exam Vitals and nursing note reviewed. Exam conducted with a chaperone present.  Constitutional:      General: He is not in acute distress.    Appearance: Normal appearance.  HENT:     Head: Normocephalic and atraumatic.  Eyes:     General: No scleral icterus.  Extraocular Movements: Extraocular movements intact.     Pupils: Pupils are equal, round, and reactive to light.  Skin:    Coloration: Skin is not jaundiced.  Neurological:     Mental Status: He is alert. Mental status is at baseline.     Coordination: Coordination normal.    ED Results / Procedures / Treatments   Labs (all labs ordered are listed, but only abnormal results are displayed) Labs Reviewed  COMPREHENSIVE METABOLIC PANEL - Abnormal; Notable for the following components:      Result Value   Glucose, Bld 174 (*)    All other components within normal limits  CBG MONITORING, ED - Abnormal; Notable for the following components:   Glucose-Capillary 174 (*)    All other components within normal limits  CBC WITH DIFFERENTIAL/PLATELET     EKG None  Radiology No results found.  Procedures Procedures   Medications Ordered in ED Medications - No data to display  ED Course  I have reviewed the triage vital signs and the nursing notes.  Pertinent labs & imaging results that were available during my care of the patient were reviewed by me and considered in my medical decision making (see chart for details).    MDM Rules/Calculators/A&P                           Patient vitals are stable, though he is mildly hypertensive.  I doubt DKA, but will check labs. Labs without electrolyte derangement, no elevated bicarb, no AKI. Has no leukocytosis, no anemia.  Patient is resting comfortably, no acute distress.  We discussed his lab work-up as well as follow-up plans.  I will refill his medication I have him follow-up with a primary care provider.  Contact information given.  I doubt DKA or any hyperglycemic crisis based on his work-up and physical exam.  Patient is appropriate for discharge at this time.   Theron Arista, PA-C 11/01/20 1305    Blane Ohara, MD 11/02/20 1740

## 2020-11-01 NOTE — Discharge Instructions (Signed)
Refills of your metformin and glipizide have been sent to the pharmacy listed.   Please pick them up and take the medicines as prescribed.   Do not see a primary care physician listed, if you do not have one please establish with the wellness center.  If not if you already have a physician EC, please establish an appointment for the next 2 weeks to follow-up.

## 2020-11-01 NOTE — ED Triage Notes (Signed)
Pt reports being out of diabetic medication for 3-4 months and needing refill. CBG 174 in triage. Also believes he is out of his HTN meds too.

## 2020-11-01 NOTE — ED Notes (Signed)
PA SAGE given discharge instructions and  discharge paperwork and work note. Pt expresses understanding and is seen walking out.

## 2020-11-01 NOTE — ED Provider Notes (Signed)
Emergency Medicine Provider Triage Evaluation Note  Francisco Carey , a 35 y.o. male  was evaluated in triage.  Pt complains of polydipsia, Polyuria, not having his diabetes medicine.  States he has been feeling increasingly worse the past month, he has not taken his side of metformin in 3 to 4 months.  He works outside and has been feeling increasingly lightheaded throughout the workday, but is not lightheaded right now.  He has headaches, but denies any vision changes, shortness of breath, chest pain..  Review of Systems  Positive: Headache, polydipsia, polyuria Negative: Vision changes, shortness of breath, chest pain  Physical Exam  There were no vitals taken for this visit. Gen:   Awake, no distress   Resp:  Normal effort  MSK:   Moves extremities without difficulty  Other:    Medical Decision Making  Medically screening exam initiated at 10:07 AM.  Appropriate orders placed.  Francisco Carey was informed that the remainder of the evaluation will be completed by another provider, this initial triage assessment does not replace that evaluation, and the importance of remaining in the ED until their evaluation is complete.  Will check check basic labs.  I doubt DKA.   Theron Arista, PA-C 11/01/20 1008    Blane Ohara, MD 11/02/20 1740

## 2022-05-17 LAB — LAB REPORT - SCANNED
A1c: 8.6
EGFR: 92

## 2022-05-21 ENCOUNTER — Encounter (HOSPITAL_BASED_OUTPATIENT_CLINIC_OR_DEPARTMENT_OTHER): Payer: Self-pay

## 2022-05-21 ENCOUNTER — Emergency Department (HOSPITAL_BASED_OUTPATIENT_CLINIC_OR_DEPARTMENT_OTHER): Payer: Self-pay | Admitting: Radiology

## 2022-05-21 ENCOUNTER — Other Ambulatory Visit: Payer: Self-pay

## 2022-05-21 ENCOUNTER — Emergency Department (HOSPITAL_BASED_OUTPATIENT_CLINIC_OR_DEPARTMENT_OTHER)
Admission: EM | Admit: 2022-05-21 | Discharge: 2022-05-21 | Disposition: A | Discharge status: Home / Self Care | Payer: Self-pay | Attending: Emergency Medicine | Admitting: Emergency Medicine

## 2022-05-21 DIAGNOSIS — I10 Essential (primary) hypertension: Secondary | ICD-10-CM | POA: Insufficient documentation

## 2022-05-21 DIAGNOSIS — Z79899 Other long term (current) drug therapy: Secondary | ICD-10-CM | POA: Insufficient documentation

## 2022-05-21 DIAGNOSIS — Z1152 Encounter for screening for COVID-19: Secondary | ICD-10-CM | POA: Insufficient documentation

## 2022-05-21 DIAGNOSIS — Z8616 Personal history of COVID-19: Secondary | ICD-10-CM | POA: Insufficient documentation

## 2022-05-21 DIAGNOSIS — Z7982 Long term (current) use of aspirin: Secondary | ICD-10-CM | POA: Insufficient documentation

## 2022-05-21 DIAGNOSIS — Z7984 Long term (current) use of oral hypoglycemic drugs: Secondary | ICD-10-CM | POA: Insufficient documentation

## 2022-05-21 DIAGNOSIS — J101 Influenza due to other identified influenza virus with other respiratory manifestations: Secondary | ICD-10-CM | POA: Insufficient documentation

## 2022-05-21 DIAGNOSIS — E119 Type 2 diabetes mellitus without complications: Secondary | ICD-10-CM | POA: Insufficient documentation

## 2022-05-21 DIAGNOSIS — Z794 Long term (current) use of insulin: Secondary | ICD-10-CM | POA: Insufficient documentation

## 2022-05-21 LAB — RESP PANEL BY RT-PCR (RSV, FLU A&B, COVID)  RVPGX2
Influenza A by PCR: POSITIVE — AB
Influenza B by PCR: NEGATIVE
Resp Syncytial Virus by PCR: NEGATIVE
SARS Coronavirus 2 by RT PCR: NEGATIVE

## 2022-05-21 LAB — CBG MONITORING, ED: Glucose-Capillary: 126 mg/dL — ABNORMAL HIGH (ref 70–99)

## 2022-05-21 LAB — GROUP A STREP BY PCR: Group A Strep by PCR: NOT DETECTED

## 2022-05-21 MED ORDER — KETOROLAC TROMETHAMINE 15 MG/ML IJ SOLN
15.0000 mg | Freq: Once | INTRAMUSCULAR | Status: AC
  Administered 2022-05-21: 15 mg via INTRAVENOUS
  Filled 2022-05-21: qty 1

## 2022-05-21 MED ORDER — ONDANSETRON HCL 4 MG/2ML IJ SOLN
4.0000 mg | Freq: Once | INTRAMUSCULAR | Status: AC
  Administered 2022-05-21: 4 mg via INTRAVENOUS
  Filled 2022-05-21: qty 2

## 2022-05-21 MED ORDER — SODIUM CHLORIDE 0.9 % IV BOLUS: 1000.0000 mL | Freq: Once | INTRAVENOUS | Status: DC

## 2022-05-21 MED ORDER — ONDANSETRON 4 MG PO TBDP: 8.0000 mg | ORAL_TABLET | Freq: Once | ORAL | Status: DC

## 2022-05-21 MED ORDER — DIPHENHYDRAMINE HCL 50 MG/ML IJ SOLN
25.0000 mg | Freq: Once | INTRAMUSCULAR | Status: DC
  Filled 2022-05-21: qty 1

## 2022-05-21 MED ORDER — ONDANSETRON HCL 4 MG PO TABS: 4.0000 mg | ORAL_TABLET | Freq: Four times a day (QID) | ORAL | 0 refills | Status: DC | PRN

## 2022-05-21 MED ORDER — PROCHLORPERAZINE EDISYLATE 10 MG/2ML IJ SOLN
10.0000 mg | Freq: Once | INTRAMUSCULAR | Status: DC
  Filled 2022-05-21: qty 2

## 2022-05-21 MED ORDER — OSELTAMIVIR PHOSPHATE 75 MG PO CAPS: 75.0000 mg | ORAL_CAPSULE | Freq: Two times a day (BID) | ORAL | 0 refills | Status: DC

## 2022-05-21 MED ORDER — SODIUM CHLORIDE 0.9 % IV BOLUS
500.0000 mL | Freq: Once | INTRAVENOUS | Status: AC
  Administered 2022-05-21: 500 mL via INTRAVENOUS

## 2022-05-21 NOTE — ED Provider Notes (Addendum)
[facility] Provider Note   CSN: [id number] Arrival date & time: 05/21/22  1656     History  Chief Complaint  Patient presents with   Cough    Francisco Carey is a 37 y.o. male.   Cough   37 year old male presents emergency department with complaints of cough, congestion, subjective fever/chills, emesis, headache.  Patient reports symptoms beginning this past Saturday.  Significant other with similar symptoms and symptoms only lasted 2 to 3 days.  Denies chest pain, shortness of breath, abdominal pain, hematemesis, nausea, urinary symptoms, change in bowel habits.  Patient is tried no medication for said symptoms.   Past medical history significant for Diabetes mellitus, hypertension  Home Medications Prior to Admission medications   Medication Sig Start Date End Date Taking? Authorizing Provider  ondansetron (ZOFRAN) 4 MG tablet Take 1 tablet (4 mg total) by mouth every 6 (six) hours as needed for nausea or vomiting. 05/21/22  Yes Dion Saucier A, PA  oseltamivir (TAMIFLU) 75 MG capsule Take 1 capsule (75 mg total) by mouth every 12 (twelve) hours. 05/21/22  Yes Dion Saucier A, PA  aspirin EC 81 MG tablet Take 81 mg by mouth daily.    [provider]  glipiZIDE (GLUCOTROL) 5 MG tablet Take 1 tablet (5 mg total) by mouth in the morning and at bedtime. 11/01/20   Sherrill Raring, PA-C  insulin detemir (LEVEMIR) 100 UNIT/ML injection Inject 0.1 mLs (10 Units total) into the skin at bedtime. Patient not taking: Reported on 01/19/2020 11/21/17   Janeece Fitting, PA-C  lisinopril (PRINIVIL,ZESTRIL) 10 MG tablet Take 1 tablet (10 mg total) by mouth daily. 11/21/17   Janeece Fitting, PA-C  metFORMIN (GLUCOPHAGE) 1000 MG tablet Take 1 tablet (1,000 mg total) by mouth 2 (two) times daily. 11/01/20   Sherrill Raring, PA-C  sildenafil (VIAGRA) 50 MG tablet Take 1 tablet (50 mg total) by mouth daily. For 15 days prn 01/19/20   Delia Heady, PA-C       Allergies    Patient has no known allergies.    Review of Systems   Review of Systems  Respiratory:  Positive for cough.   All other systems reviewed and are negative.   Physical Exam Updated Vital Signs BP (!) 166/106 (BP Location: Right Arm)   Pulse (!) 103   Temp 98.7 F (37.1 C) (Oral)   Resp 18   Ht 6' 2"$  (1.88 m)   Wt 99.8 kg   SpO2 99%   BMI 28.25 kg/m  Physical Exam Vitals and nursing note reviewed.  Constitutional:      General: He is not in acute distress.    Appearance: He is well-developed.  HENT:     Head: Normocephalic and atraumatic.     Nose: Congestion and rhinorrhea present.     Mouth/Throat:     Comments: Patient with mild posterior pharyngeal erythema.  Tonsils are 1+ bilaterally with no obvious exudate.  No sublingual or submandibular swelling appreciated. Eyes:     Conjunctiva/sclera: Conjunctivae normal.  Neck:     Comments: Kernig and Brudzinski negative Cardiovascular:     Rate and Rhythm: Normal rate and regular rhythm.     Heart sounds: No murmur heard. Pulmonary:     Effort: Pulmonary effort is normal. No respiratory distress.     Breath sounds: Normal breath sounds. No wheezing, rhonchi or rales.  Abdominal:     Palpations: Abdomen is soft.     Tenderness: There is no  abdominal tenderness. There is no right CVA tenderness or left CVA tenderness.  Musculoskeletal:        General: No swelling.     Cervical back: Neck supple. No rigidity.     Right lower leg: No edema.     Left lower leg: No edema.  Skin:    General: Skin is warm and dry.     Capillary Refill: Capillary refill takes less than 2 seconds.  Neurological:     Mental Status: He is alert.     Comments: Alert and oriented to self, place, time and event.   Speech is fluent, clear without dysarthria or dysphasia.   Strength 5/5 in upper/lower extremities   Sensation intact in upper/lower extremities   Normal gait.  Negative Romberg. No pronator drift.  Normal  finger-to-nose and feet tapping.  CN I not tested  CN II not tested CN III, IV, VI PERRLA and EOMs intact bilaterally  CN V Intact sensation to sharp and light touch to the face  CN VII facial movements symmetric  CN VIII not tested  CN IX, X no uvula deviation, symmetric rise of soft palate  CN XI 5/5 SCM and trapezius strength bilaterally  CN XII Midline tongue protrusion, symmetric L/R movements     Psychiatric:        Mood and Affect: Mood normal.     ED Results / Procedures / Treatments   Labs (all labs ordered are listed, but only abnormal results are displayed) Labs Reviewed  RESP PANEL BY RT-PCR (RSV, FLU A&B, COVID)  RVPGX2 - Abnormal; Notable for the following components:      Result Value   Influenza A by PCR POSITIVE (*)    All other components within normal limits  CBG MONITORING, ED - Abnormal; Notable for the following components:   Glucose-Capillary 126 (*)    All other components within normal limits  GROUP A STREP BY PCR    EKG None  Radiology DG Chest 2 View  Result Date: 05/21/2022 CLINICAL DATA:  Cough EXAM: CHEST - 2 VIEW COMPARISON:  November 21, 2017 chest radiograph FINDINGS: The heart size and mediastinal contours are within normal limits. No focal airspace consolidation. No pleural effusion. No pneumothorax. The visualized skeletal structures are unremarkable. IMPRESSION: No acute cardiopulmonary disease. Electronically Signed   By: Dahlia Bailiff M.D.   On: 05/21/2022 18:46    Procedures Procedures    Medications Ordered in ED Medications  ketorolac (TORADOL) 15 MG/ML injection 15 mg (15 mg Intravenous Given 05/21/22 1916)  ondansetron (ZOFRAN) injection 4 mg (4 mg Intravenous Given 05/21/22 1916)  sodium chloride 0.9 % bolus 500 mL (0 mLs Intravenous Stopped 05/21/22 1945)    ED Course/ Medical Decision Making/ A&P                             Medical Decision Making Amount and/or Complexity of Data Reviewed Radiology:  ordered.  Risk Prescription drug management.   This patient presents to the ED for concern of flulike illness, this involves an extensive number of treatment options, and is a complaint that carries with it a high risk of complications and morbidity.  The differential diagnosis includes influenza, COVID, RSV, pneumonia, sepsis, CVA, cerebral venous thrombosis, meningitis/encephalitis, malignancy, electrolyte abnormality   Co morbidities that complicate the patient evaluation  See HPI   Additional history obtained:  Additional history obtained from EMR External records from outside source obtained and reviewed including  hospital records   Lab Tests:  I Ordered, and personally interpreted labs.  The pertinent results include: Respiratory viral panel positive for influenza A, group A strep negative.  CBG of 126   Imaging Studies ordered:  I ordered imaging studies including chest x-ray I independently visualized and interpreted imaging which showed no acute cardiopulmonary abnormality I agree with the radiologist interpretation   Cardiac Monitoring: / EKG:  The patient was maintained on a cardiac monitor.  I personally viewed and interpreted the cardiac monitored which showed an underlying rhythm of: Sinus rhythm   Consultations Obtained:  N/a   Problem List / ED Course / Critical interventions / Medication management  Influenza A I ordered medication including Toradol, Zofran, normal saline   Reevaluation of the patient after these medicines showed that the patient improved I have reviewed the patients home medicines and have made adjustments as needed   Social Determinants of Health:  Denies tobacco, illicit drug use   Test / Admission - Considered:  Influenza A Vitals signs significant for initial tachycardia with a heart rate of 103 and hypertension with a blood pressure 166/106 of which decreased with time elapsed on the emergency department.. Otherwise  within normal range and stable throughout visit. Laboratory studies significant for: See above Patient symptoms like most likely secondary to current influenza A infection.  Patient without neurologic deficits doubt CVA, cerebral venous thrombosis.  Patient without obvious meningismus.  Patient responded well to migraine cocktail administered while emergency department.  Recommend symptomatic therapy outpatient with Tylenol/Motrin as needed for pain/fever, daily antihistamine, daily nasal steroid spray.  Recommend follow-up with primary care for reassessment of symptoms.  Patient overall well-appearing, afebrile in no acute respiratory distress.  Treatment plan discussed at length with patient he acknowledged understanding was agreeable to said plan. Worrisome signs and symptoms were discussed with the patient, and the patient acknowledged understanding to return to the ED if noticed. Patient was stable upon discharge.          Final Clinical Impression(s) / ED Diagnoses Final diagnoses:  Influenza A    Rx / DC Orders ED Discharge Orders          Ordered    ondansetron (ZOFRAN) 4 MG tablet  Every 6 hours PRN        05/21/22 1850    oseltamivir (TAMIFLU) 75 MG capsule  Every 12 hours        05/21/22 1850              Wilnette Kales, [place] 05/22/22 [place], [place] A, PA 05/22/22 [id number]    Dorie Rank, MD 05/24/22 1016

## 2022-05-21 NOTE — Discharge Instructions (Addendum)
Note the workup today was overall consistent with influenza A infection.  As discussed, we will send in medicine to take as needed for nausea/vomiting.  Recommend symptomatic therapy at home with Tylenol/Motrin as needed for fever/body aches, daily allergy medicine in the form of Zyrtec/Allegra/Claritin, nasal steroid spray in the form of Flonase/Nasacort.  Maintain proper oral hydration via electrolyte rich fluids such as body armor, sugar-free Gatorade, liquid IV, Pedialyte.  I will also send a medicine called Tamiflu for current influenza infection.  Recommend follow-up with primary care for reassessment of symptoms.  Please do not hesitate to return to emergency department for worrisome signs and symptoms we discussed become apparent.

## 2022-05-21 NOTE — ED Triage Notes (Signed)
Patient here POV from Home.  Endorses Headache, Sinus Pressure, nausea, Body Aches, Productive Cough, mild sore throat that began Saturday. Some Diarrhea.   No known fevers.   NAD Noted during Triage. A&Ox4. GCS 15. Ambulatory.

## 2022-05-29 ENCOUNTER — Ambulatory Visit: Payer: Self-pay | Admitting: Medical

## 2022-09-20 ENCOUNTER — Emergency Department (HOSPITAL_BASED_OUTPATIENT_CLINIC_OR_DEPARTMENT_OTHER): Payer: Managed Care, Other (non HMO) | Admitting: Radiology

## 2022-09-20 ENCOUNTER — Other Ambulatory Visit: Payer: Self-pay

## 2022-09-20 ENCOUNTER — Emergency Department (HOSPITAL_BASED_OUTPATIENT_CLINIC_OR_DEPARTMENT_OTHER): Payer: Managed Care, Other (non HMO)

## 2022-09-20 ENCOUNTER — Emergency Department (HOSPITAL_BASED_OUTPATIENT_CLINIC_OR_DEPARTMENT_OTHER)
Admission: EM | Admit: 2022-09-20 | Discharge: 2022-09-20 | Disposition: A | Discharge status: Home / Self Care | Payer: Managed Care, Other (non HMO) | Attending: Emergency Medicine | Admitting: Emergency Medicine

## 2022-09-20 ENCOUNTER — Encounter (HOSPITAL_BASED_OUTPATIENT_CLINIC_OR_DEPARTMENT_OTHER): Payer: Self-pay | Admitting: Emergency Medicine

## 2022-09-20 DIAGNOSIS — R0789 Other chest pain: Secondary | ICD-10-CM

## 2022-09-20 LAB — CBC
HCT: 42.9 % (ref 39.0–52.0)
Hemoglobin: 14.5 g/dL (ref 13.0–17.0)
MCH: 28.3 pg (ref 26.0–34.0)
MCHC: 33.8 g/dL (ref 30.0–36.0)
MCV: 83.6 fL (ref 80.0–100.0)
Platelets: 321 10*3/uL (ref 150–400)
RBC: 5.13 MIL/uL (ref 4.22–5.81)
RDW: 13.2 % (ref 11.5–15.5)
WBC: 11.2 10*3/uL — ABNORMAL HIGH (ref 4.0–10.5)
nRBC: 0 % (ref 0.0–0.2)

## 2022-09-20 LAB — TROPONIN I (HIGH SENSITIVITY)
Troponin I (High Sensitivity): 28 ng/L — ABNORMAL HIGH (ref <18)
Troponin I (High Sensitivity): 28 ng/L — ABNORMAL HIGH (ref <18)

## 2022-09-20 LAB — BASIC METABOLIC PANEL
Anion gap: 12 (ref 5–15)
BUN: 19 mg/dL (ref 6–20)
CO2: 24 mmol/L (ref 22–32)
Calcium: 9.5 mg/dL (ref 8.9–10.3)
Chloride: 101 mmol/L (ref 98–111)
Creatinine, Ser: 1.25 mg/dL — ABNORMAL HIGH (ref 0.61–1.24)
GFR, Estimated: >60 mL/min (ref >60)
Glucose, Bld: 149 mg/dL — ABNORMAL HIGH (ref 70–99)
Potassium: 3.7 mmol/L (ref 3.5–5.1)
Sodium: 137 mmol/L (ref 135–145)

## 2022-09-20 LAB — D-DIMER, QUANTITATIVE: D-Dimer, Quant: 0.9 ug/mL-FEU — ABNORMAL HIGH (ref 0.00–0.50)

## 2022-09-20 MED ORDER — IOHEXOL 350 MG/ML SOLN
100.0000 mL | Freq: Once | INTRAVENOUS | Status: AC | PRN
  Administered 2022-09-20: 75 mL via INTRAVENOUS

## 2022-09-20 MED ORDER — ASPIRIN 81 MG PO CHEW: 81.0000 mg | CHEWABLE_TABLET | Freq: Every day | ORAL | 0 refills | Status: AC

## 2022-09-20 MED ORDER — ASPIRIN 81 MG PO CHEW
324.0000 mg | CHEWABLE_TABLET | Freq: Once | ORAL | Status: AC
  Administered 2022-09-20: 324 mg via ORAL
  Filled 2022-09-20: qty 4

## 2022-09-20 NOTE — Discharge Instructions (Signed)
It was a pleasure caring for you today. CT was without concern for pulmonary embolism. I have placed a referral to cardiology for your chest pain.  They should call you at the beginning of next week.  Seek emergency care if experiencing any new or worsening symptoms such as change in chest pain or chest pain that does not resolve with rest.  I also sent in a prescription for aspirin for you to take once daily.

## 2022-09-20 NOTE — ED Triage Notes (Signed)
Pt here from home with c/o chest pain center ofhis chest non radiating , no sob , n/v

## 2022-09-20 NOTE — ED Provider Notes (Signed)
[facility] EMERGENCY DEPARTMENT AT [facility] Provider Note   CSN: [id number] Arrival date & time: 09/20/22  1441     History  Chief Complaint  Patient presents with   Chest Pain    KALIK ZECHER is a 37 y.o. male.  Patient with history of hypertension, hyperlipidemia, and diabetes presents today with complaints of chest pain. He states that same began when he woke up this morning around 2:30 am and is worse with exertion and relieved with rest.  Pain is substernal to left-sided in nature and feels like pressure.  He also endorses some exertional dyspnea as well which is new for him. He denies any cardiac history. He does not smoke and denies recreational drug use.  Denies leg pain or leg swelling.  No recent travel or recent surgeries.  No history of malignancy.  Currently laying in bed, he is pain-free, however he does state that he walked to the bathroom earlier and had pain. Denies fevers, chills, recent illness, cough, or congestion.  No diaphoresis, nausea, or vomiting.  The history is provided by the patient. No language interpreter was used.  Chest Pain      Home Medications Prior to Admission medications   Medication Sig Start Date End Date Taking? Authorizing Provider  aspirin EC 81 MG tablet Take 81 mg by mouth daily.    [provider]  glipiZIDE (GLUCOTROL) 5 MG tablet Take 1 tablet (5 mg total) by mouth in the morning and at bedtime. 11/01/20   Theron Arista, PA-C  insulin detemir (LEVEMIR) 100 UNIT/ML injection Inject 0.1 mLs (10 Units total) into the skin at bedtime. Patient not taking: Reported on 01/19/2020 11/21/17   Claude Manges, PA-C  lisinopril (PRINIVIL,ZESTRIL) 10 MG tablet Take 1 tablet (10 mg total) by mouth daily. 11/21/17   Claude Manges, PA-C  metFORMIN (GLUCOPHAGE) 1000 MG tablet Take 1 tablet (1,000 mg total) by mouth 2 (two) times daily. 11/01/20   Theron Arista, PA-C  ondansetron (ZOFRAN) 4 MG tablet Take 1 tablet (4 mg total) by mouth  every 6 (six) hours as needed for nausea or vomiting. 05/21/22   Peter Garter, PA  oseltamivir (TAMIFLU) 75 MG capsule Take 1 capsule (75 mg total) by mouth every 12 (twelve) hours. 05/21/22   Peter Garter, PA  sildenafil (VIAGRA) 50 MG tablet Take 1 tablet (50 mg total) by mouth daily. For 15 days prn 01/19/20   Dietrich Pates, PA-C      Allergies    Patient has no known allergies.    Review of Systems   Review of Systems  Cardiovascular:  Positive for chest pain.  All other systems reviewed and are negative.   Physical Exam Updated Vital Signs BP (!) 148/99 (BP Location: Right Arm)   Pulse 90   Temp 98.2 F (36.8 C) (Oral)   Resp 20   SpO2 100%  Physical Exam Vitals and nursing note reviewed.  Constitutional:      General: He is not in acute distress.    Appearance: Normal appearance. He is normal weight. He is not ill-appearing, toxic-appearing or diaphoretic.  HENT:     Head: Normocephalic and atraumatic.  Cardiovascular:     Rate and Rhythm: Normal rate and regular rhythm.     Pulses:          Dorsalis pedis pulses are 2+ on the right side and 2+ on the left side.       Posterior tibial pulses are 2+ on the right  side and 2+ on the left side.     Heart sounds: Normal heart sounds.  Pulmonary:     Effort: Pulmonary effort is normal. No respiratory distress.     Breath sounds: Normal breath sounds.  Chest:     Chest wall: No tenderness.  Abdominal:     General: Abdomen is flat.     Palpations: Abdomen is soft.     Tenderness: There is no abdominal tenderness.  Musculoskeletal:        General: Normal range of motion.     Cervical back: Normal range of motion.     Right lower leg: No tenderness. No edema.     Left lower leg: No tenderness. No edema.  Skin:    General: Skin is warm and dry.  Neurological:     General: No focal deficit present.     Mental Status: He is alert.  Psychiatric:        Mood and Affect: Mood normal.        Behavior: Behavior  normal.     ED Results / Procedures / Treatments   Labs (all labs ordered are listed, but only abnormal results are displayed) Labs Reviewed  BASIC METABOLIC PANEL - Abnormal; Notable for the following components:      Result Value   Glucose, Bld 149 (*)    Creatinine, Ser 1.25 (*)    All other components within normal limits  CBC - Abnormal; Notable for the following components:   WBC 11.2 (*)    All other components within normal limits  D-DIMER, QUANTITATIVE - Abnormal; Notable for the following components:   D-Dimer, Quant 0.90 (*)    All other components within normal limits  TROPONIN I (HIGH SENSITIVITY) - Abnormal; Notable for the following components:   Troponin I (High Sensitivity) 28 (*)    All other components within normal limits  TROPONIN I (HIGH SENSITIVITY) - Abnormal; Notable for the following components:   Troponin I (High Sensitivity) 28 (*)    All other components within normal limits    EKG EKG Interpretation  Date/Time:  Friday September 20 2022 14:49:44 EDT Ventricular Rate:  106 PR Interval:  122 QRS Duration: 70 QT Interval:  328 QTC Calculation: 435 R Axis:   56 Text Interpretation: Sinus tachycardia Otherwise normal ECG When compared with ECG of 21-Nov-2017 20:40, PREVIOUS ECG IS PRESENT Since last tracing rate faster Confirmed by Jacalyn Lefevre [phone]) on 09/20/2022 3:22:52 PM  Radiology DG Chest 2 View  Result Date: 09/20/2022 CLINICAL DATA:  cp EXAM: CHEST - 2 VIEW COMPARISON:  CXR 05/21/22 FINDINGS: The heart size and mediastinal contours are within normal limits. Both lungs are clear. The visualized skeletal structures are unremarkable. IMPRESSION: No active cardiopulmonary disease. Electronically Signed   By: Lorenza Cambridge M.D.   On: 09/20/2022 15:29    Procedures Procedures    Medications Ordered in ED Medications  aspirin chewable tablet 324 mg (324 mg Oral Given 09/20/22 1615)  iohexol (OMNIPAQUE) 350 MG/ML injection 100 mL (75 mLs  Intravenous Contrast Given 09/20/22 1956)    ED Course/ Medical Decision Making/ A&P                             Medical Decision Making Amount and/or Complexity of Data Reviewed Labs: ordered. Radiology: ordered.  Risk OTC drugs. Prescription drug management.   This patient is a 37 y.o. male who presents to the ED for concern of chest  pain, this involves an extensive number of treatment options, and is a complaint that carries with it a high risk of complications and morbidity. The emergent differential diagnosis prior to evaluation includes, but is not limited to,  ACS, pericarditis, myocarditis, aortic dissection, PE, pneumothorax, esophageal spasm or rupture, chronic angina, pneumonia, bronchitis, GERD, reflux/PUD, biliary disease, pancreatitis, costochondritis, anxiety  This is not an exhaustive differential.   Past Medical History / Co-morbidities / Social History: history of hypertension, hyperlipidemia, and diabetes   Physical Exam: Physical exam performed. The pertinent findings include: no acute physical exam findings  Lab Tests: I ordered, and personally interpreted labs.  The pertinent results include:  WBC 11.2, troponin 28 --> 28, creatinine 1.25 up from 1.04 1 year ago, d dimer 0.90   Imaging Studies: I ordered imaging studies including CXR, CT PE. I independently visualized and interpreted imaging which showed   CXR: NAD  I agree with the radiologist interpretation.  PE study pending at shift change   Cardiac Monitoring:  The patient was maintained on a cardiac monitor.  My attending physician Dr. Particia Nearing viewed and interpreted the cardiac monitored which showed an underlying rhythm of: sinus tachycardia. I agree with this interpretation.   Medications: I ordered medication including 324 ASA  for chest pain. Reevaluation of the patient after these medicines showed that the patient stayed the same. I have reviewed the patients home medicines and have made  adjustments as needed.  Disposition:  Patient with no known cardiac history presents today with chest pressure onset 2:30 am this morning that is exertional in nature and relieved by rest.  He is afebrile, nontoxic-appearing, and in no acute distress with reassuring vital signs.  His troponins are notably elevated at 28 but are flat.  He does note some exertional dyspnea as well and is notably tachycardic rate around 100.  He has no other risk factors for PE, however unfortunately his D-dimer was elevated.  CTA PE pending at shift change.  If normal, would consider cardiology consult given heart score of 4 and concerning story.  Care handoff to [facility], PA-C at shift change. Please see their note for continued evaluation and dispo.  I discussed this case with my attending physician Dr. Deretha Emory who cosigned this note including patient's presenting symptoms, physical exam, and planned diagnostics and interventions. Attending physician stated agreement with plan or made changes to plan which were implemented.    Final Clinical Impression(s) / ED Diagnoses Final diagnoses:  None    Rx / DC Orders ED Discharge Orders     None         Vear Clock 09/20/22 2030    Vanetta Mulders, MD 09/23/22 1214

## 2022-09-20 NOTE — ED Notes (Signed)
Discharge instructions provided by edp were discussed with pt. Pt verbalized understanding with no additional questions at this time.  

## 2022-09-20 NOTE — ED Notes (Signed)
Patient transported to CT 

## 2022-09-20 NOTE — ED Provider Notes (Signed)
  Accepted handoff at shift change from Lurena Nida PA-C. Please see prior provider note for more detail.   Briefly: Patient is 37 y.o. "complaints of chest pain. He states that same began when he woke up this morning around 2:30 am and is worse with exertion and relieved with rest. Pain is substernal to left-sided in nature and feels like pressure. He also endorses some exertional dyspnea as well which is new for him."   DDX: concern for  ACS, pericarditis, myocarditis, aortic dissection, PE, pneumothorax, esophageal spasm or rupture, chronic angina, pneumonia, bronchitis, GERD, reflux/PUD, biliary disease, pancreatitis, costochondritis, anxiety    MDM: CTA without concern for PE or acute cardiopulmonary disease. Patient requesting to go home. I educated patient that I was hoping to consult cardiology and then refer him to outpatient cardiology. Patient stating that he would rather leave now and follow up with outpatient cardiology. Educated patient that he would need to take ASA daily until cleared by cardiology. Patient verbally endorsed understanding of plan. Also provided patient with strict return precautions to seek emergency care if chest pain becomes more severe or does not relieve with rest. Patient verbally expressed understanding. Patient afebrile with stable vitals.           Dorthy Cooler, [place] 09/20/22 2223    Vanetta Mulders, MD 09/23/22 [phone]

## 2023-03-26 ENCOUNTER — Encounter (HOSPITAL_COMMUNITY): Payer: Self-pay

## 2023-03-26 ENCOUNTER — Emergency Department (HOSPITAL_COMMUNITY): Payer: 59

## 2023-03-26 ENCOUNTER — Other Ambulatory Visit: Payer: Self-pay

## 2023-03-26 ENCOUNTER — Emergency Department (HOSPITAL_COMMUNITY)
Admission: EM | Admit: 2023-03-26 | Discharge: 2023-03-26 | Disposition: A | Discharge status: Home / Self Care | Payer: 59 | Attending: Emergency Medicine | Admitting: Emergency Medicine

## 2023-03-26 DIAGNOSIS — I10 Essential (primary) hypertension: Secondary | ICD-10-CM | POA: Diagnosis not present

## 2023-03-26 DIAGNOSIS — Z794 Long term (current) use of insulin: Secondary | ICD-10-CM | POA: Diagnosis not present

## 2023-03-26 DIAGNOSIS — M25572 Pain in left ankle and joints of left foot: Secondary | ICD-10-CM | POA: Insufficient documentation

## 2023-03-26 DIAGNOSIS — E119 Type 2 diabetes mellitus without complications: Secondary | ICD-10-CM | POA: Diagnosis not present

## 2023-03-26 DIAGNOSIS — Z7984 Long term (current) use of oral hypoglycemic drugs: Secondary | ICD-10-CM | POA: Diagnosis not present

## 2023-03-26 DIAGNOSIS — Z7982 Long term (current) use of aspirin: Secondary | ICD-10-CM | POA: Insufficient documentation

## 2023-03-26 DIAGNOSIS — Z760 Encounter for issue of repeat prescription: Secondary | ICD-10-CM | POA: Insufficient documentation

## 2023-03-26 DIAGNOSIS — Z79899 Other long term (current) drug therapy: Secondary | ICD-10-CM | POA: Insufficient documentation

## 2023-03-26 LAB — CBG MONITORING, ED: Glucose-Capillary: 200 mg/dL — ABNORMAL HIGH (ref 70–99)

## 2023-03-26 MED ORDER — LISINOPRIL 10 MG PO TABS
10.0000 mg | ORAL_TABLET | Freq: Once | ORAL | Status: AC
  Administered 2023-03-26: 10 mg via ORAL
  Filled 2023-03-26: qty 1

## 2023-03-26 MED ORDER — GLIPIZIDE 5 MG PO TABS: 5.0000 mg | ORAL_TABLET | Freq: Every day | ORAL | 0 refills | Status: DC

## 2023-03-26 MED ORDER — SILDENAFIL CITRATE 50 MG PO TABS: 50.0000 mg | ORAL_TABLET | Freq: Every day | ORAL | 0 refills | Status: AC | PRN

## 2023-03-26 MED ORDER — IBUPROFEN 800 MG PO TABS
800.0000 mg | ORAL_TABLET | Freq: Once | ORAL | Status: AC
  Administered 2023-03-26: 800 mg via ORAL
  Filled 2023-03-26: qty 1

## 2023-03-26 MED ORDER — GLIPIZIDE 5 MG PO TABS
5.0000 mg | ORAL_TABLET | Freq: Once | ORAL | Status: AC
  Administered 2023-03-26: 5 mg via ORAL
  Filled 2023-03-26: qty 1

## 2023-03-26 MED ORDER — LISINOPRIL 10 MG PO TABS: 10.0000 mg | ORAL_TABLET | Freq: Every day | ORAL | 1 refills | Status: DC

## 2023-03-26 MED ORDER — GLIPIZIDE 5 MG PO TABS
5.0000 mg | ORAL_TABLET | Freq: Every day | ORAL | Status: DC
  Filled 2023-03-26: qty 1

## 2023-03-26 MED ORDER — ACETAMINOPHEN 325 MG PO TABS
650.0000 mg | ORAL_TABLET | Freq: Once | ORAL | Status: AC
  Administered 2023-03-26: 650 mg via ORAL
  Filled 2023-03-26: qty 2

## 2023-03-26 NOTE — ED Triage Notes (Signed)
Pt states that he has had left ankle pain that began upon awakening. Pt denies specific injury. Pt also reports that he needs refills of his medications because he can't see his PMD until next year due to insurance issues. Pt is hypertensive but report that he has not taken his BP meds in > 2 months.

## 2023-03-26 NOTE — Discharge Instructions (Addendum)
Please follow-up with your PCP at your earliest convenience.  Please begin taking lisinopril 10 mg every day for blood pressure control as well as Levacet 5 mg before meals.  You may take a do not fill as needed.  Please utilize ankle brace provided for ankle pain.  Please read attached guide concerning RICE therapy.  Please take ibuprofen or Tylenol every 6 hours for pain and ankle.  Follow-up orthopedics.

## 2023-03-26 NOTE — ED Provider Notes (Signed)
[place] EMERGENCY DEPARTMENT AT [facility] Provider Note   CSN: [id number] Arrival date & time: 03/26/23  0130     History  Chief Complaint  Patient presents with   Ankle Pain    Francisco Carey is a 37 y.o. male with medical history of diabetes, hypertension.  The patient presents to the ED for evaluation of left ankle pain as well as medication refill.  Patient reports that he woke up this morning around 2 AM with lateral left ankle pain that he believes is related to a past injury where he had a high ankle sprain.  He reports that he was getting up to go by basketball but was unable to play basketball secondary to his ankle pain.  Denies any recent injury to his left ankle, denies any trauma to his left ankle.  Denies medications prior to arrival.  Also here requesting medication refill to include lisinopril, glipizide as well as sildenafil.  Denies chest pain or shortness of breath.  Reports he is unable to see his PCP until January secondary to insurance issues.   Ankle Pain      Home Medications Prior to Admission medications   Medication Sig Start Date End Date Taking? Authorizing Provider  glipiZIDE (GLUCOTROL) 5 MG tablet Take 1 tablet (5 mg total) by mouth daily before breakfast. 03/26/23  Yes Al Decant, PA-C  lisinopril (ZESTRIL) 10 MG tablet Take 1 tablet (10 mg total) by mouth daily. 03/26/23  Yes Al Decant, PA-C  sildenafil (VIAGRA) 50 MG tablet Take 1 tablet (50 mg total) by mouth daily as needed for erectile dysfunction. 03/26/23  Yes Al Decant, PA-C  aspirin EC 81 MG tablet Take 81 mg by mouth daily.    [provider]  insulin detemir (LEVEMIR) 100 UNIT/ML injection Inject 0.1 mLs (10 Units total) into the skin at bedtime. Patient not taking: Reported on 01/19/2020 11/21/17   Claude Manges, PA-C  metFORMIN (GLUCOPHAGE) 1000 MG tablet Take 1 tablet (1,000 mg total) by mouth 2 (two) times daily. 11/01/20   Theron Arista, PA-C  ondansetron (ZOFRAN) 4 MG tablet Take 1 tablet (4 mg total) by mouth every 6 (six) hours as needed for nausea or vomiting. 05/21/22   Peter Garter, PA  oseltamivir (TAMIFLU) 75 MG capsule Take 1 capsule (75 mg total) by mouth every 12 (twelve) hours. 05/21/22   Peter Garter, PA      Allergies    Patient has no known allergies.    Review of Systems   Review of Systems  Respiratory:  Negative for shortness of breath.   Cardiovascular:  Negative for chest pain.  Musculoskeletal:  Positive for arthralgias and myalgias.  All other systems reviewed and are negative.   Physical Exam Updated Vital Signs BP (!) 162/113 (BP Location: Left Arm)   Pulse 68   Temp 99.2 F (37.3 C) (Oral)   Resp 20   Ht 6\' 2"  (1.88 m)   Wt 99.8 kg   SpO2 98%   BMI 28.25 kg/m  Physical Exam Vitals and nursing note reviewed.  Constitutional:      General: He is not in acute distress.    Appearance: Normal appearance. He is not ill-appearing, toxic-appearing or diaphoretic.  HENT:     Head: Normocephalic and atraumatic.     Nose: Nose normal.     Mouth/Throat:     Mouth: Mucous membranes are moist.     Pharynx: Oropharynx is clear.  Eyes:  Extraocular Movements: Extraocular movements intact.     Conjunctiva/sclera: Conjunctivae normal.     Pupils: Pupils are equal, round, and reactive to light.  Cardiovascular:     Rate and Rhythm: Normal rate and regular rhythm.  Pulmonary:     Effort: Pulmonary effort is normal.     Breath sounds: Normal breath sounds. No wheezing.  Abdominal:     General: Abdomen is flat. Bowel sounds are normal.     Palpations: Abdomen is soft.     Tenderness: There is no abdominal tenderness.  Musculoskeletal:     Cervical back: Normal range of motion and neck supple. No tenderness.     Comments: Intact range of motion of left ankle.  No overlying skin change, deformity noted.  2+ DP pulse.  Skin:    General: Skin is warm and dry.     Capillary  Refill: Capillary refill takes less than 2 seconds.  Neurological:     Mental Status: He is alert and oriented to person, place, and time.     ED Results / Procedures / Treatments   Labs (all labs ordered are listed, but only abnormal results are displayed) Labs Reviewed  CBG MONITORING, ED - Abnormal; Notable for the following components:      Result Value   Glucose-Capillary 200 (*)    All other components within normal limits    EKG None  Radiology DG Ankle Complete Left Result Date: 03/26/2023 CLINICAL DATA:  Ankle pain EXAM: LEFT ANKLE COMPLETE - 3+ VIEW COMPARISON:  None Available. FINDINGS: There is no acute fracture or dislocation identified. Joint spaces are well maintained. The soft tissues are within normal limits. 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 There is no surrounding soft tissue swelling or aggressive osseous features. IMPRESSION: 1. No acute fracture or dislocation. 2. 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 Electronically Signed   By: Darliss Cheney M.D.   On: 03/26/2023 02:09    Procedures Procedures   Medications Ordered in ED Medications  ibuprofen (ADVIL) tablet 800 mg (has no administration in time range)  acetaminophen (TYLENOL) tablet 650 mg (has no administration in time range)  lisinopril (ZESTRIL) tablet 10 mg (has no administration in time range)  glipiZIDE (GLUCOTROL) tablet 5 mg (has no administration in time range)    ED Course/ Medical Decision Making/ A&P  Medical Decision Making Amount and/or Complexity of Data Reviewed Radiology: ordered.  Risk OTC drugs. Prescription drug management.   37 year old male presents for evaluation.  Please see HPI for further details.  On examination the patient is afebrile and nontachycardic.   His lung sounds are clear bilaterally, he is nonhypoxic.  Abdomen is soft and compressible throughout.  Neurological examination is at baseline.  Patient left ankle has no deformity, overlying skin change.  He has full range of motion of his left ankle.  2+ DP pulse in the left foot.  Denies any trauma to his left ankle.  X-ray left ankle unremarkable.  Patient will be placed in ASO lace up ankle brace and referred to orthopedics.  Patient will also be provided with refills of medications.  He was advised to follow-up with his PCP at his earliest convenience.  Stable to discharge home.  Encouraged to RICE ankle at home.   Final Clinical Impression(s) / ED Diagnoses Final diagnoses:  Acute left ankle pain    Rx /  DC Orders ED Discharge Orders          Ordered    sildenafil (VIAGRA) 50 MG tablet  Daily PRN        03/26/23 0323    glipiZIDE (GLUCOTROL) 5 MG tablet  Daily before breakfast        03/26/23 0323    lisinopril (ZESTRIL) 10 MG tablet  Daily        03/26/23 0323              Al Decant, PA-C 03/26/23 0324    Tilden Fossa, MD 03/26/23 [phone]

## 2023-05-21 LAB — LAB REPORT - SCANNED
A1c: 9.5
Albumin, Urine POC: 232.4
Creatinine, POC: 64 mg/dL
EGFR: 71
Microalb Creat Ratio: 3631

## 2023-05-29 ENCOUNTER — Other Ambulatory Visit (HOSPITAL_BASED_OUTPATIENT_CLINIC_OR_DEPARTMENT_OTHER): Payer: Self-pay

## 2023-05-29 ENCOUNTER — Emergency Department (HOSPITAL_BASED_OUTPATIENT_CLINIC_OR_DEPARTMENT_OTHER)
Admission: EM | Admit: 2023-05-29 | Discharge: 2023-05-29 | Disposition: A | Discharge status: Home / Self Care | Payer: Commercial Managed Care - HMO | Attending: Emergency Medicine | Admitting: Emergency Medicine

## 2023-05-29 ENCOUNTER — Emergency Department (HOSPITAL_BASED_OUTPATIENT_CLINIC_OR_DEPARTMENT_OTHER): Payer: Commercial Managed Care - HMO

## 2023-05-29 ENCOUNTER — Other Ambulatory Visit: Payer: Self-pay

## 2023-05-29 ENCOUNTER — Encounter (HOSPITAL_BASED_OUTPATIENT_CLINIC_OR_DEPARTMENT_OTHER): Payer: Self-pay | Admitting: Emergency Medicine

## 2023-05-29 DIAGNOSIS — Z794 Long term (current) use of insulin: Secondary | ICD-10-CM | POA: Diagnosis not present

## 2023-05-29 DIAGNOSIS — R519 Headache, unspecified: Secondary | ICD-10-CM | POA: Diagnosis present

## 2023-05-29 DIAGNOSIS — E119 Type 2 diabetes mellitus without complications: Secondary | ICD-10-CM | POA: Insufficient documentation

## 2023-05-29 DIAGNOSIS — Z20822 Contact with and (suspected) exposure to covid-19: Secondary | ICD-10-CM | POA: Insufficient documentation

## 2023-05-29 DIAGNOSIS — Z7984 Long term (current) use of oral hypoglycemic drugs: Secondary | ICD-10-CM | POA: Diagnosis not present

## 2023-05-29 DIAGNOSIS — Z7982 Long term (current) use of aspirin: Secondary | ICD-10-CM | POA: Insufficient documentation

## 2023-05-29 DIAGNOSIS — Z79899 Other long term (current) drug therapy: Secondary | ICD-10-CM | POA: Diagnosis not present

## 2023-05-29 DIAGNOSIS — I10 Essential (primary) hypertension: Secondary | ICD-10-CM | POA: Diagnosis not present

## 2023-05-29 LAB — COMPREHENSIVE METABOLIC PANEL
ALT: 15 U/L (ref 0–44)
AST: 17 U/L (ref 15–41)
Albumin: 3.1 g/dL — ABNORMAL LOW (ref 3.5–5.0)
Alkaline Phosphatase: 61 U/L (ref 38–126)
Anion gap: 8 (ref 5–15)
BUN: 22 mg/dL — ABNORMAL HIGH (ref 6–20)
CO2: 28 mmol/L (ref 22–32)
Calcium: 8.6 mg/dL — ABNORMAL LOW (ref 8.9–10.3)
Chloride: 101 mmol/L (ref 98–111)
Creatinine, Ser: 1.22 mg/dL (ref 0.61–1.24)
GFR, Estimated: >60 mL/min (ref >60)
Glucose, Bld: 210 mg/dL — ABNORMAL HIGH (ref 70–99)
Potassium: 3.8 mmol/L (ref 3.5–5.1)
Sodium: 137 mmol/L (ref 135–145)
Total Bilirubin: 0.4 mg/dL (ref 0.0–1.2)
Total Protein: 6.3 g/dL — ABNORMAL LOW (ref 6.5–8.1)

## 2023-05-29 LAB — URINALYSIS, ROUTINE W REFLEX MICROSCOPIC
Bilirubin Urine: NEGATIVE
Glucose, UA: 250 mg/dL — AB
Ketones, ur: NEGATIVE mg/dL
Leukocytes,Ua: NEGATIVE
Nitrite: NEGATIVE
Protein, ur: >300 mg/dL — AB
Specific Gravity, Urine: 1.014 (ref 1.005–1.030)
pH: 7 (ref 5.0–8.0)

## 2023-05-29 LAB — CBC WITH DIFFERENTIAL/PLATELET
Abs Immature Granulocytes: 0.02 10*3/uL (ref 0.00–0.07)
Basophils Absolute: 0.1 10*3/uL (ref 0.0–0.1)
Basophils Relative: 1 %
Eosinophils Absolute: 0.4 10*3/uL (ref 0.0–0.5)
Eosinophils Relative: 5 %
HCT: 42.5 % (ref 39.0–52.0)
Hemoglobin: 13.8 g/dL (ref 13.0–17.0)
Immature Granulocytes: 0 %
Lymphocytes Relative: 20 %
Lymphs Abs: 1.6 10*3/uL (ref 0.7–4.0)
MCH: 27.7 pg (ref 26.0–34.0)
MCHC: 32.5 g/dL (ref 30.0–36.0)
MCV: 85.3 fL (ref 80.0–100.0)
Monocytes Absolute: 0.8 10*3/uL (ref 0.1–1.0)
Monocytes Relative: 10 %
Neutro Abs: 5.1 10*3/uL (ref 1.7–7.7)
Neutrophils Relative %: 64 %
Platelets: 353 10*3/uL (ref 150–400)
RBC: 4.98 MIL/uL (ref 4.22–5.81)
RDW: 12.7 % (ref 11.5–15.5)
WBC: 8 10*3/uL (ref 4.0–10.5)
nRBC: 0 % (ref 0.0–0.2)

## 2023-05-29 LAB — RESP PANEL BY RT-PCR (RSV, FLU A&B, COVID)  RVPGX2
Influenza A by PCR: NEGATIVE
Influenza B by PCR: NEGATIVE
Resp Syncytial Virus by PCR: NEGATIVE
SARS Coronavirus 2 by RT PCR: NEGATIVE

## 2023-05-29 MED ORDER — LACTATED RINGERS IV BOLUS
1000.0000 mL | Freq: Once | INTRAVENOUS | Status: AC
  Administered 2023-05-29: 1000 mL via INTRAVENOUS

## 2023-05-29 MED ORDER — METOCLOPRAMIDE HCL 5 MG/ML IJ SOLN
10.0000 mg | Freq: Once | INTRAMUSCULAR | Status: AC
  Administered 2023-05-29: 10 mg via INTRAVENOUS
  Filled 2023-05-29: qty 2

## 2023-05-29 MED ORDER — MAGNESIUM SULFATE 2 GM/50ML IV SOLN
2.0000 g | Freq: Once | INTRAVENOUS | Status: AC
  Administered 2023-05-29: 2 g via INTRAVENOUS
  Filled 2023-05-29: qty 50

## 2023-05-29 MED ORDER — DIPHENHYDRAMINE HCL 25 MG PO CAPS
25.0000 mg | ORAL_CAPSULE | Freq: Once | ORAL | Status: AC
  Administered 2023-05-29: 25 mg via ORAL
  Filled 2023-05-29: qty 1

## 2023-05-29 MED ORDER — AMLODIPINE BESYLATE 5 MG PO TABS
10.0000 mg | ORAL_TABLET | Freq: Every day | ORAL | 0 refills | Status: DC
  Filled 2023-05-29: qty 30, 15d supply, fill #0

## 2023-05-29 MED ORDER — AMLODIPINE BESYLATE 5 MG PO TABS
10.0000 mg | ORAL_TABLET | Freq: Once | ORAL | Status: AC
  Administered 2023-05-29: 10 mg via ORAL
  Filled 2023-05-29: qty 2

## 2023-05-29 MED ORDER — LABETALOL HCL 5 MG/ML IV SOLN
10.0000 mg | Freq: Once | INTRAVENOUS | Status: AC
  Administered 2023-05-29: 10 mg via INTRAVENOUS
  Filled 2023-05-29: qty 4

## 2023-05-29 NOTE — ED Provider Notes (Signed)
 [place] EMERGENCY DEPARTMENT AT [facility] Provider Note   CSN: [id number] Arrival date & time: 05/29/23  1033     History  Chief Complaint  Patient presents with   Headache    JAICOB DIA is a 38 y.o. male.   Headache  Patient is a 38 year old male hx of htn, hld, DM  Patient with a headache for 1 week each day.  He states the headache is worse when he wakes up in the morning and gradually improves.  HA improved over the course the day.  No fevers or neck stiffness.  No head injuries no loss of consciousness no nausea or vomiting.  He denies any chest pain difficulty breathing syncope or near syncope.  He states that he has been prescribed a pressure medication in the past amlodipine but is not taking currently states it is waiting for him at the pharmacy.     Home Medications Prior to Admission medications   Medication Sig Start Date End Date Taking? Authorizing Provider  ezetimibe (ZETIA) 10 MG tablet Take 10 mg by mouth daily.   Yes [provider]  amLODipine (NORVASC) 5 MG tablet Take 2 tablets (10 mg total) by mouth daily. 05/29/23   Gailen Shelter, PA  aspirin EC 81 MG tablet Take 81 mg by mouth daily.    [provider]  glipiZIDE (GLUCOTROL) 5 MG tablet Take 1 tablet (5 mg total) by mouth daily before breakfast. Patient taking differently: Take 1 mg by mouth daily before breakfast. 03/26/23   Al Decant, PA-C  insulin detemir (LEVEMIR) 100 UNIT/ML injection Inject 0.1 mLs (10 Units total) into the skin at bedtime. Patient not taking: Reported on 01/19/2020 11/21/17   Claude Manges, PA-C  metFORMIN (GLUCOPHAGE) 1000 MG tablet Take 1 tablet (1,000 mg total) by mouth 2 (two) times daily. 11/01/20   Theron Arista, PA-C  ondansetron (ZOFRAN) 4 MG tablet Take 1 tablet (4 mg total) by mouth every 6 (six) hours as needed for nausea or vomiting. 05/21/22   Peter Garter, PA  oseltamivir (TAMIFLU) 75 MG capsule Take 1 capsule (75  mg total) by mouth every 12 (twelve) hours. 05/21/22   Peter Garter, PA  sildenafil (VIAGRA) 50 MG tablet Take 1 tablet (50 mg total) by mouth daily as needed for erectile dysfunction. 03/26/23   Al Decant, PA-C      Allergies    Patient has no known allergies.    Review of Systems   Review of Systems  Neurological:  Positive for headaches.    Physical Exam Updated Vital Signs BP (!) 159/105   Pulse 83   Temp 98.1 F (36.7 C) (Oral)   Resp 18   SpO2 98%  Physical Exam Vitals and nursing note reviewed.  Constitutional:      General: He is not in acute distress. HENT:     Head: Normocephalic and atraumatic.     Nose: Nose normal.  Eyes:     General: No scleral icterus. Cardiovascular:     Rate and Rhythm: Normal rate and regular rhythm.     Pulses: Normal pulses.     Heart sounds: Normal heart sounds.  Pulmonary:     Effort: Pulmonary effort is normal. No respiratory distress.     Breath sounds: No wheezing.  Abdominal:     Palpations: Abdomen is soft.     Tenderness: There is no abdominal tenderness. There is no guarding or rebound.  Musculoskeletal:     Cervical  back: Normal range of motion.     Right lower leg: No edema.     Left lower leg: No edema.  Skin:    General: Skin is warm and dry.     Capillary Refill: Capillary refill takes less than 2 seconds.  Neurological:     Mental Status: He is alert. Mental status is at baseline.     Comments: Alert and oriented to self, place, time and event.   Speech is fluent, clear without dysarthria or dysphasia.   Strength 5/5 in upper/lower extremities   Sensation intact in upper/lower extremities   CN I not tested  CN II grossly intact visual fields bilaterally. Did not visualize posterior eye.  CN III, IV, VI PERRLA and EOMs intact bilaterally  CN V Intact sensation to sharp and light touch to the face  CN VII facial movements symmetric  CN VIII not tested  CN IX, X no uvula deviation, symmetric  rise of soft palate  CN XI 5/5 SCM and trapezius strength bilaterally  CN XII Midline tongue protrusion, symmetric L/R movements   Psychiatric:        Mood and Affect: Mood normal.        Behavior: Behavior normal.     ED Results / Procedures / Treatments   Labs (all labs ordered are listed, but only abnormal results are displayed) Labs Reviewed  COMPREHENSIVE METABOLIC PANEL - Abnormal; Notable for the following components:      Result Value   Glucose, Bld 210 (*)    BUN 22 (*)    Calcium 8.6 (*)    Total Protein 6.3 (*)    Albumin 3.1 (*)    All other components within normal limits  URINALYSIS, ROUTINE W REFLEX MICROSCOPIC - Abnormal; Notable for the following components:   Glucose, UA 250 (*)    Hgb urine dipstick MODERATE (*)    Protein, ur >300 (*)    Bacteria, UA RARE (*)    All other components within normal limits  RESP PANEL BY RT-PCR (RSV, FLU A&B, COVID)  RVPGX2  CBC WITH DIFFERENTIAL/PLATELET    EKG None  Radiology CT Head Wo Contrast Result Date: 05/29/2023 CLINICAL DATA:  Persistent right-sided headache for the past week. EXAM: CT HEAD WITHOUT CONTRAST TECHNIQUE: Contiguous axial images were obtained from the base of the skull through the vertex without intravenous contrast. RADIATION DOSE REDUCTION: This exam was performed according to the departmental dose-optimization program which includes automated exposure control, adjustment of the mA and/or kV according to patient size and/or use of iterative reconstruction technique. COMPARISON:  MRI brain dated February 16, 2012. FINDINGS: Brain: No evidence of acute infarction, hemorrhage, hydrocephalus, extra-axial collection or mass lesion/mass effect. Vascular: No hyperdense vessel or unexpected calcification. Skull: Normal. Negative for fracture or focal lesion. Sinuses/Orbits: No acute finding. Other: None. IMPRESSION: 1. No acute intracranial abnormality. Electronically Signed   By: Obie Dredge M.D.   On:  05/29/2023 13:36    Procedures Procedures    Medications Ordered in ED Medications  lactated ringers bolus 1,000 mL (0 mLs Intravenous Stopped 05/29/23 1442)  metoCLOPramide (REGLAN) injection 10 mg (10 mg Intravenous Given 05/29/23 1334)  diphenhydrAMINE (BENADRYL) capsule 25 mg (25 mg Oral Given 05/29/23 1333)  magnesium sulfate IVPB 2 g 50 mL (0 g Intravenous Stopped 05/29/23 1442)  labetalol (NORMODYNE) injection 10 mg (10 mg Intravenous Given 05/29/23 1445)  amLODipine (NORVASC) tablet 10 mg (10 mg Oral Given 05/29/23 1540)    ED Course/ Medical Decision Making/  A&P Clinical Course as of 05/29/23 1931  Thu May 29, 2023  1315 Headache for 1 week daily, worse in the AM. Improves over the day. No fevers.  [WF]    Clinical Course User Index [WF] Gailen Shelter, [place]                                 Medical Decision Making Amount and/or Complexity of Data Reviewed Labs: ordered. Radiology: ordered.  Risk Prescription drug management.   Patient is a 38 year old male hx of htn, hld, DM  Patient with a headache for 1 week each day.  He states the headache is worse when he wakes up in the morning and gradually improves.  HA improved over the course the day.  No fevers or neck stiffness.  No head injuries no loss of consciousness no nausea or vomiting.  He denies any chest pain difficulty breathing syncope or near syncope.  He states that he has been prescribed a pressure medication in the past amlodipine but is not taking currently states it is waiting for him at the pharmacy.  Physical exam unremarkable  CT head unremarkable labs unremarkable apart from elevated glucose he has history of DM states that he has not been taking his glipizide but will restart this.  He is also prescribed insulin which she has plenty of.  Workup reassuring here in the emergency department.  Blood pressure is improved to 159/105.  Will discharge home with continued amlodipine recommendations to  follow-up with primary care in the next 3 to 5 days.  Return precautions emergency room discussed.  Patient agreeable to plan will discharge home at this time.   Final Clinical Impression(s) / ED Diagnoses Final diagnoses:  Acute nonintractable headache, unspecified headache type  Hypertension, unspecified type    Rx / DC Orders ED Discharge Orders          Ordered    amLODipine (NORVASC) 5 MG tablet  Daily        05/29/23 1610              Solon Augusta [employer], [place] 05/29/23 Aretha Parrot    Linwood Dibbles, MD 05/30/23 1512

## 2023-05-29 NOTE — ED Notes (Signed)
 Pt transported to CT ?

## 2023-05-29 NOTE — ED Triage Notes (Signed)
 Pt c/o RT side HA x 1 week.

## 2023-05-29 NOTE — Discharge Instructions (Signed)
 Please follow-up with your primary care doctor ASAP.  You are already taking amlodipine I recommend increasing this to 10 mg a day your blood pressure today has been quite elevated however it did improve here in the ER.  Please return to the ER for chest pain difficulty breathing or new severe headaches.

## 2023-05-29 NOTE — ED Notes (Signed)

## 2023-06-01 ENCOUNTER — Encounter (HOSPITAL_BASED_OUTPATIENT_CLINIC_OR_DEPARTMENT_OTHER): Payer: Self-pay

## 2023-06-01 ENCOUNTER — Other Ambulatory Visit: Payer: Self-pay

## 2023-06-01 ENCOUNTER — Emergency Department (HOSPITAL_BASED_OUTPATIENT_CLINIC_OR_DEPARTMENT_OTHER)
Admission: EM | Admit: 2023-06-01 | Discharge: 2023-06-01 | Disposition: A | Discharge status: Home / Self Care | Payer: Commercial Managed Care - HMO | Attending: Emergency Medicine | Admitting: Emergency Medicine

## 2023-06-01 DIAGNOSIS — R809 Proteinuria, unspecified: Secondary | ICD-10-CM | POA: Insufficient documentation

## 2023-06-01 DIAGNOSIS — Z794 Long term (current) use of insulin: Secondary | ICD-10-CM | POA: Diagnosis not present

## 2023-06-01 DIAGNOSIS — Z79899 Other long term (current) drug therapy: Secondary | ICD-10-CM | POA: Insufficient documentation

## 2023-06-01 DIAGNOSIS — Z7982 Long term (current) use of aspirin: Secondary | ICD-10-CM | POA: Diagnosis not present

## 2023-06-01 DIAGNOSIS — I10 Essential (primary) hypertension: Secondary | ICD-10-CM | POA: Diagnosis not present

## 2023-06-01 DIAGNOSIS — Z7984 Long term (current) use of oral hypoglycemic drugs: Secondary | ICD-10-CM | POA: Insufficient documentation

## 2023-06-01 DIAGNOSIS — E1165 Type 2 diabetes mellitus with hyperglycemia: Secondary | ICD-10-CM | POA: Diagnosis not present

## 2023-06-01 DIAGNOSIS — R519 Headache, unspecified: Secondary | ICD-10-CM | POA: Diagnosis present

## 2023-06-01 LAB — CBG MONITORING, ED: Glucose-Capillary: 213 mg/dL — ABNORMAL HIGH (ref 70–99)

## 2023-06-01 MED ORDER — LISINOPRIL 10 MG PO TABS: 10.0000 mg | ORAL_TABLET | Freq: Every day | ORAL | 3 refills | Status: DC

## 2023-06-01 MED ORDER — CLONIDINE HCL 0.1 MG PO TABS
0.2000 mg | ORAL_TABLET | Freq: Once | ORAL | Status: AC
  Administered 2023-06-01: 0.2 mg via ORAL
  Filled 2023-06-01: qty 2

## 2023-06-01 MED ORDER — LOSARTAN POTASSIUM 50 MG PO TABS: 50.0000 mg | ORAL_TABLET | Freq: Every day | ORAL | 2 refills | Status: DC

## 2023-06-01 NOTE — ED Triage Notes (Signed)
 Pt was seen here for headache and HTN x 3 days ago and prescribed amlodipine, which he states that he has taken daily. Pt's concern is that he continues to have a dull headache that worsens when he lies down to sleep.

## 2023-06-01 NOTE — ED Provider Notes (Addendum)
 [place] EMERGENCY DEPARTMENT AT [facility] Provider Note   CSN: [id number] Arrival date & time: 06/01/23  0206     History  Chief Complaint  Patient presents with   Headache    Francisco Carey is a 38 y.o. male.  Patient returns to the emergency department with persistent headache.  Patient seen 3 days ago and was diagnosed with hypertension.  He was started on Norvasc, reports that he has been taking the medication as prescribed.  Patient reports persistent dull headache that worsens when he lays down at night to try and sleep.       Home Medications Prior to Admission medications   Medication Sig Start Date End Date Taking? Authorizing Provider  lisinopril (ZESTRIL) 10 MG tablet Take 1 tablet (10 mg total) by mouth daily. 06/01/23  Yes Horton Ellithorpe, Canary Brim, MD  amLODipine (NORVASC) 5 MG tablet Take 2 tablets (10 mg total) by mouth daily. 05/29/23   Gailen Shelter, PA  aspirin EC 81 MG tablet Take 81 mg by mouth daily.    [provider]  ezetimibe (ZETIA) 10 MG tablet Take 10 mg by mouth daily.    [provider]  glipiZIDE (GLUCOTROL) 5 MG tablet Take 1 tablet (5 mg total) by mouth daily before breakfast. Patient taking differently: Take 1 mg by mouth daily before breakfast. 03/26/23   Al Decant, PA-C  insulin detemir (LEVEMIR) 100 UNIT/ML injection Inject 0.1 mLs (10 Units total) into the skin at bedtime. Patient not taking: Reported on 01/19/2020 11/21/17   Claude Manges, PA-C  metFORMIN (GLUCOPHAGE) 1000 MG tablet Take 1 tablet (1,000 mg total) by mouth 2 (two) times daily. 11/01/20   Theron Arista, PA-C  ondansetron (ZOFRAN) 4 MG tablet Take 1 tablet (4 mg total) by mouth every 6 (six) hours as needed for nausea or vomiting. 05/21/22   Peter Garter, PA  oseltamivir (TAMIFLU) 75 MG capsule Take 1 capsule (75 mg total) by mouth every 12 (twelve) hours. 05/21/22   Peter Garter, PA  sildenafil (VIAGRA) 50 MG tablet Take 1  tablet (50 mg total) by mouth daily as needed for erectile dysfunction. 03/26/23   Al Decant, PA-C      Allergies    Patient has no known allergies.    Review of Systems   Review of Systems  Physical Exam Updated Vital Signs BP (!) 173/113   Pulse 94   Temp 97.9 F (36.6 C) (Oral)   Resp 16   SpO2 99%  Physical Exam Vitals and nursing note reviewed.  Constitutional:      General: He is not in acute distress.    Appearance: He is well-developed.  HENT:     Head: Normocephalic and atraumatic.     Mouth/Throat:     Mouth: Mucous membranes are moist.  Eyes:     General: Vision grossly intact. Gaze aligned appropriately.     Extraocular Movements: Extraocular movements intact.     Conjunctiva/sclera: Conjunctivae normal.  Cardiovascular:     Rate and Rhythm: Normal rate and regular rhythm.     Pulses: Normal pulses.     Heart sounds: Normal heart sounds, S1 normal and S2 normal. No murmur heard.    No friction rub. No gallop.  Pulmonary:     Effort: Pulmonary effort is normal. No respiratory distress.     Breath sounds: Normal breath sounds.  Abdominal:     Palpations: Abdomen is soft.     Tenderness: There is no  abdominal tenderness. There is no guarding or rebound.     Hernia: No hernia is present.  Musculoskeletal:        General: No swelling.     Cervical back: Full passive range of motion without pain, normal range of motion and neck supple. No pain with movement, spinous process tenderness or muscular tenderness. Normal range of motion.     Right lower leg: No edema.     Left lower leg: No edema.  Skin:    General: Skin is warm and dry.     Capillary Refill: Capillary refill takes less than 2 seconds.     Findings: No ecchymosis, erythema, lesion or wound.  Neurological:     Mental Status: He is alert and oriented to person, place, and time.     GCS: GCS eye subscore is 4. GCS verbal subscore is 5. GCS motor subscore is 6.     Cranial Nerves: Cranial  nerves 2-12 are intact.     Sensory: Sensation is intact.     Motor: Motor function is intact. No weakness or abnormal muscle tone.     Coordination: Coordination is intact.  Psychiatric:        Mood and Affect: Mood normal.        Speech: Speech normal.        Behavior: Behavior normal.     ED Results / Procedures / Treatments   Labs (all labs ordered are listed, but only abnormal results are displayed) Labs Reviewed  CBG MONITORING, ED - Abnormal; Notable for the following components:      Result Value   Glucose-Capillary 213 (*)    All other components within normal limits    EKG None  Radiology No results found.  Procedures Procedures    Medications Ordered in ED Medications  cloNIDine (CATAPRES) tablet 0.2 mg (0.2 mg Oral Given 06/01/23 0310)    ED Course/ Medical Decision Making/ A&P                                 Medical Decision Making Risk Prescription drug management.   Presents with persistent headache.  Seen 3 days ago, had a CT head that was unremarkable.  Patient continues to have a normal neurologic exam, does not require repeat imaging.  Blood pressure still elevated on the Norvasc 5 mg.  Reviewing his records from the other day, he did have significant proteinuria as well.  He is a diabetic.  He will benefit from adding an ACE inhibitor to his regimen.  He will need nephrology follow-up.  Addendum: Patient reports he did not tolerate lisinopril in the past.  Will change to losartan.      Final Clinical Impression(s) / ED Diagnoses Final diagnoses:  Proteinuria, unspecified type  Uncontrolled hypertension    Rx / DC Orders ED Discharge Orders          Ordered    lisinopril (ZESTRIL) 10 MG tablet  Daily        06/01/23 0335              Gilda Crease, MD 06/01/23 0335    Gilda Crease, MD 06/01/23 [phone]

## 2023-06-02 ENCOUNTER — Emergency Department (HOSPITAL_BASED_OUTPATIENT_CLINIC_OR_DEPARTMENT_OTHER)
Admission: EM | Admit: 2023-06-02 | Discharge: 2023-06-03 | Discharge status: Against Medical Advice | Payer: Commercial Managed Care - HMO | Attending: Emergency Medicine | Admitting: Emergency Medicine

## 2023-06-02 ENCOUNTER — Other Ambulatory Visit: Payer: Self-pay

## 2023-06-02 ENCOUNTER — Encounter (HOSPITAL_BASED_OUTPATIENT_CLINIC_OR_DEPARTMENT_OTHER): Payer: Self-pay | Admitting: Emergency Medicine

## 2023-06-02 DIAGNOSIS — Z5321 Procedure and treatment not carried out due to patient leaving prior to being seen by health care provider: Secondary | ICD-10-CM | POA: Diagnosis not present

## 2023-06-02 DIAGNOSIS — R030 Elevated blood-pressure reading, without diagnosis of hypertension: Secondary | ICD-10-CM | POA: Insufficient documentation

## 2023-06-02 DIAGNOSIS — R2243 Localized swelling, mass and lump, lower limb, bilateral: Secondary | ICD-10-CM | POA: Insufficient documentation

## 2023-06-02 NOTE — ED Notes (Signed)
 No answer when called to triage for lab collection.

## 2023-06-02 NOTE — ED Triage Notes (Addendum)
 High blood pressure Seen multiple time for similar. Rxd amlodipine and losartan. Today noticed some bilateral leg swelling and discomfort

## 2023-06-09 ENCOUNTER — Other Ambulatory Visit (HOSPITAL_BASED_OUTPATIENT_CLINIC_OR_DEPARTMENT_OTHER): Payer: Self-pay

## 2023-06-12 ENCOUNTER — Other Ambulatory Visit: Payer: Self-pay | Admitting: *Deleted

## 2023-06-12 DIAGNOSIS — M7989 Other specified soft tissue disorders: Secondary | ICD-10-CM

## 2023-06-17 ENCOUNTER — Ambulatory Visit (INDEPENDENT_AMBULATORY_CARE_PROVIDER_SITE_OTHER)
Admission: RE | Admit: 2023-06-17 | Discharge: 2023-06-17 | Disposition: A | Discharge status: Home / Self Care | Payer: Commercial Managed Care - HMO | Source: Ambulatory Visit | Attending: Vascular Surgery | Admitting: Vascular Surgery

## 2023-06-17 ENCOUNTER — Ambulatory Visit (HOSPITAL_COMMUNITY)
Admission: RE | Admit: 2023-06-17 | Discharge: 2023-06-17 | Disposition: A | Discharge status: Home / Self Care | Payer: Commercial Managed Care - HMO | Source: Ambulatory Visit | Attending: Vascular Surgery | Admitting: Vascular Surgery

## 2023-06-17 DIAGNOSIS — M7989 Other specified soft tissue disorders: Secondary | ICD-10-CM | POA: Diagnosis not present

## 2023-06-17 LAB — VAS US ABI WITH/WO TBI
Left ABI: 1.1
Right ABI: 1.24

## 2023-06-18 NOTE — Progress Notes (Deleted)
 [facility]           [facility] [place]  History and Physical   Francisco Carey is a 38 y.o. male who presents with ***  ***  The pt does *** have hx of previous venous procedures. The patient has *** history of DVT. Pt does *** history of varicose vein.   Pt does *** history of skin changes in lower legs.   There is *** family history of venous disorders.   The patient has *** used compression stockings in the past.    The pt is not on a statin for cholesterol management.  The pt is on a daily aspirin.   Other AC:  none The pt is on CCB, ACEI for hypertension.   The pt is  on medication for diabetes.   Tobacco hx:  never  Pt does *** have family hx of AAA.  Past Medical History:  Diagnosis Date   Diabetes mellitus without complication (HCC)    Hypertension     Past Surgical History:  Procedure Laterality Date   KNEE SURGERY     KNEE SURGERY      Social History   Socioeconomic History   Marital status: Single    Spouse name: Not on file   Number of children: Not on file   Years of education: Not on file   Highest education level: Not on file  Occupational History   Not on file  Tobacco Use   Smoking status: Never   Smokeless tobacco: Never  Vaping Use   Vaping status: Never Used  Substance and Sexual Activity   Alcohol use: Yes    Comment: occ   Drug use: No   Sexual activity: Not on file  Other Topics Concern   Not on file  Social History Narrative   Not on file   Social Drivers of Health   Financial Resource Strain: Not on file  Food Insecurity: Not on file  Transportation Needs: Not on file  Physical Activity: Not on file  Stress: Not on file  Social Connections: Not on file  Intimate Partner Violence: Not on file    ***No family history on file.  Current Outpatient Medications  Medication Sig Dispense Refill   amLODipine (NORVASC) 5 MG tablet Take 2 tablets (10 mg total) by mouth daily. 30 tablet 0    aspirin EC 81 MG tablet Take 81 mg by mouth daily.     ezetimibe (ZETIA) 10 MG tablet Take 10 mg by mouth daily.     glipiZIDE (GLUCOTROL) 5 MG tablet Take 1 tablet (5 mg total) by mouth daily before breakfast. (Patient taking differently: Take 1 mg by mouth daily before breakfast.) 30 tablet 0   insulin detemir (LEVEMIR) 100 UNIT/ML injection Inject 0.1 mLs (10 Units total) into the skin at bedtime. (Patient not taking: Reported on 01/19/2020) 3 mL 30   lisinopril (ZESTRIL) 10 MG tablet Take 1 tablet (10 mg total) by mouth daily. 30 tablet 3   losartan (COZAAR) 50 MG tablet Take 1 tablet (50 mg total) by mouth daily. 30 tablet 2   metFORMIN (GLUCOPHAGE) 1000 MG tablet Take 1 tablet (1,000 mg total) by mouth 2 (two) times daily. 180 tablet 2   ondansetron (ZOFRAN) 4 MG tablet Take 1 tablet (4 mg total) by mouth every 6 (six) hours as needed for nausea or vomiting. 12 tablet 0   oseltamivir (TAMIFLU) 75 MG capsule Take 1 capsule (75  mg total) by mouth every 12 (twelve) hours. 10 capsule 0   sildenafil (VIAGRA) 50 MG tablet Take 1 tablet (50 mg total) by mouth daily as needed for erectile dysfunction. 20 tablet 0   No current facility-administered medications for this visit.    No Known Allergies  REVIEW OF SYSTEMS:  *** [X]  denotes positive finding, [ ]  denotes negative finding Cardiac  Comments:  Chest pain or chest pressure:    Shortness of breath upon exertion:    Short of breath when lying flat:    Irregular heart rhythm:        Vascular    Pain in calf, thigh, or hip brought on by ambulation:    Pain in feet at night that wakes you up from your sleep:     Blood clot in your veins:    Leg swelling:  x       Pulmonary    Oxygen at home:    Productive cough:     Wheezing:         Neurologic    Sudden weakness in arms or legs:     Sudden numbness in arms or legs:     Sudden onset of difficulty speaking or slurred speech:    Temporary loss of vision in one eye:     Problems  with dizziness:         Gastrointestinal    Blood in stool:     Vomited blood:         Genitourinary    Burning when urinating:     Blood in urine:        Psychiatric    Major depression:         Hematologic    Bleeding problems:    Problems with blood clotting too easily:        Skin    Rashes or ulcers:        Constitutional    Fever or chills:      PHYSICAL EXAMINATION:  ***  General:  WDWN in NAD; vital signs documented above Gait: Not observed HENT: WNL, normocephalic Pulmonary: normal non-labored breathing without wheezing Cardiac: {Desc; regular/irreg:14544} HR; {With/Without:20273} carotid bruit*** Abdomen: soft, NT, aortic pulse is *** palpable Skin: {With/Without:20273} rashes Vascular Exam/Pulses:  Right Left  Radial {Exam; arterial pulse strength 0-4:30167} {Exam; arterial pulse strength 0-4:30167}  DP {Exam; arterial pulse strength 0-4:30167} {Exam; arterial pulse strength 0-4:30167}  PT {Exam; arterial pulse strength 0-4:30167} {Exam; arterial pulse strength 0-4:30167}   Extremities: ***  Neurologic: A&O X 3;  moving all extremities equally Psychiatric:  The pt has {Desc; normal/abnormal:11317::"Normal"} affect.   Non-Invasive Vascular Imaging:   Venous duplex on 06/17/2023: Venous Reflux Times  +--------------+---------+------+-----------+------------+--------+  RIGHT        Reflux NoRefluxReflux TimeDiameter cmsComments                          Yes                                   +--------------+---------+------+-----------+------------+--------+  Popliteal              yes   >1 second                       +--------------+---------+------+-----------+------------+--------+  GSV at SFJ              yes    >  500 ms      0.71              +--------------+---------+------+-----------+------------+--------+  GSV prox thighno                            0.34               +--------------+---------+------+-----------+------------+--------+  GSV mid thigh no                            0.34              +--------------+---------+------+-----------+------------+--------+  GSV dist thighno                            0.31              +--------------+---------+------+-----------+------------+--------+  GSV at knee   no                            0.36              +--------------+---------+------+-----------+------------+--------+  GSV prox calf no                            0.34              +--------------+---------+------+-----------+------------+--------+  GSV mid calf            yes    >500 ms      0.41              +--------------+---------+------+-----------+------------+--------+  SSV Pop Fossa no                            0.52              +--------------+---------+------+-----------+------------+--------+  SSV prox calf no                            0.40              +--------------+---------+------+-----------+------------+--------+  SSV mid calf  no                            0.40              +--------------+---------+------+-----------+------------+--------+     Summary:  Right:  - No evidence of deep vein thrombosis seen in the right lower extremity.  - No evidence of superficial venous thrombosis in the right lower  extremity. - Venous reflux is noted in the right popliteal vein.    - Venous reflux is noted in the right sapheno-femoral junction.  - Venous reflux is noted in the right greater saphenous vein in the calf.   ABI 06/17/2023 ABI Findings:  +---------+------------------+-----+-----------+  Right   Rt Pressure (mmHg)IndexWaveform     +---------+------------------+-----+-----------+  Brachial 165                                 +---------+------------------+-----+-----------+  PTA     182               1.10 multiphasic   +---------+------------------+-----+-----------+  DP  205               1.24 multiphasic  +---------+------------------+-----+-----------+  Great Toe143               0.87 Normal       +---------+------------------+-----+-----------+   +---------+------------------+-----+-----------+  Left    Lt Pressure (mmHg)IndexWaveform     +---------+------------------+-----+-----------+  Brachial 161                                 +---------+------------------+-----+-----------+  PTA     182               1.10 multiphasic  +---------+------------------+-----+-----------+  DP      170               1.03 multiphasic  +---------+------------------+-----+-----------+  Great Toe173               1.05 Normal       +---------+------------------+-----+-----------+   +-------+-----------+-----------+  ABI/TBIToday's ABIToday's TBI  +-------+-----------+-----------+  Right 1.24       0.87         +-------+-----------+-----------+  Left  1.10       1.05         +-------+-----------+-----------+  Summary:  Right: Resting right ankle-brachial index is within normal range. The  right toe-brachial index is normal.   Left: Resting left ankle-brachial index is within normal range. The left  toe-brachial index is normal     Francisco Carey is a 38 y.o. male who presents with: ***    -pt has *** pedal pulses -pt does not have evidence of DVT.  Pt does have venous reflux in the right deep venous system and the GSV at the [facility] and in the mid calf.   -discussed with pt about wearing *** high *** mmHg compression stockings and pt was measured for these today.   *** -discussed the importance of leg elevation and how to elevate properly - pt is advised to elevate their legs and a diagram is given to them to demonstrate for pt to lay flat on their back with knees elevated and slightly bent with their feet higher than their knees, which puts their feet higher  than their heart for 15 minutes per day.  If pt cannot lay flat, advised to lay as flat as possible.  -pt is advised to continue as much walking as possible and avoid sitting or standing for long periods of time.  -discussed importance of weight loss and exercise and that water aerobics would also be beneficial.  -handout with recommendations given -pt will f/u ***   Maryanna Smart, [facility] [phone]  Clinic MD:  Rosalva Comber

## 2023-06-19 ENCOUNTER — Other Ambulatory Visit: Payer: Self-pay

## 2023-06-19 ENCOUNTER — Emergency Department (HOSPITAL_BASED_OUTPATIENT_CLINIC_OR_DEPARTMENT_OTHER)
Admission: EM | Admit: 2023-06-19 | Discharge: 2023-06-19 | Disposition: A | Discharge status: Home / Self Care | Attending: Emergency Medicine | Admitting: Emergency Medicine

## 2023-06-19 ENCOUNTER — Encounter (HOSPITAL_BASED_OUTPATIENT_CLINIC_OR_DEPARTMENT_OTHER): Payer: Self-pay | Admitting: Emergency Medicine

## 2023-06-19 DIAGNOSIS — Z7984 Long term (current) use of oral hypoglycemic drugs: Secondary | ICD-10-CM | POA: Insufficient documentation

## 2023-06-19 DIAGNOSIS — Z79899 Other long term (current) drug therapy: Secondary | ICD-10-CM | POA: Insufficient documentation

## 2023-06-19 DIAGNOSIS — Z7982 Long term (current) use of aspirin: Secondary | ICD-10-CM | POA: Diagnosis not present

## 2023-06-19 DIAGNOSIS — I10 Essential (primary) hypertension: Secondary | ICD-10-CM | POA: Insufficient documentation

## 2023-06-19 NOTE — ED Provider Notes (Addendum)
 [place] EMERGENCY DEPARTMENT AT [facility] Provider Note   CSN: [id number] Arrival date & time: 06/19/23  4098     History  Chief Complaint  Patient presents with   Hypertension    Francisco Carey is a 38 y.o. male.  38 yo M with a chief complaints of high blood pressure.  He recently saw his family doctor and had an increase of his lisinopril.  He felt like things were doing better and then he went out last night and when he checked his blood pressure this morning it was higher than it had been.  He told his father and his sister about it and they encouraged him to come to the ED for evaluation.  He denies headache one-sided numbness or weakness or difficulty speech or swallowing.  Denies chest pain or difficulty breathing.   Hypertension       Home Medications Prior to Admission medications   Medication Sig Start Date End Date Taking? Authorizing Provider  amLODipine (NORVASC) 5 MG tablet Take 2 tablets (10 mg total) by mouth daily. 05/29/23   Gailen Shelter, PA  aspirin EC 81 MG tablet Take 81 mg by mouth daily.    [provider]  ezetimibe (ZETIA) 10 MG tablet Take 10 mg by mouth daily.    [provider]  glipiZIDE (GLUCOTROL) 5 MG tablet Take 1 tablet (5 mg total) by mouth daily before breakfast. Patient taking differently: Take 1 mg by mouth daily before breakfast. 03/26/23   Al Decant, PA-C  insulin detemir (LEVEMIR) 100 UNIT/ML injection Inject 0.1 mLs (10 Units total) into the skin at bedtime. Patient not taking: Reported on 01/19/2020 11/21/17   Claude Manges, PA-C  lisinopril (ZESTRIL) 10 MG tablet Take 1 tablet (10 mg total) by mouth daily. 06/01/23   Gilda Crease, MD  losartan (COZAAR) 50 MG tablet Take 1 tablet (50 mg total) by mouth daily. 06/01/23   Gilda Crease, MD  metFORMIN (GLUCOPHAGE) 1000 MG tablet Take 1 tablet (1,000 mg total) by mouth 2 (two) times daily. 11/01/20   Theron Arista, PA-C   ondansetron (ZOFRAN) 4 MG tablet Take 1 tablet (4 mg total) by mouth every 6 (six) hours as needed for nausea or vomiting. 05/21/22   Peter Garter, PA  oseltamivir (TAMIFLU) 75 MG capsule Take 1 capsule (75 mg total) by mouth every 12 (twelve) hours. 05/21/22   Peter Garter, PA  sildenafil (VIAGRA) 50 MG tablet Take 1 tablet (50 mg total) by mouth daily as needed for erectile dysfunction. 03/26/23   Al Decant, PA-C      Allergies    Patient has no known allergies.    Review of Systems   Review of Systems  Physical Exam Updated Vital Signs BP (!) 166/110   Pulse 90   Temp (!) 97.5 F (36.4 C)   Resp 18   Wt 103.4 kg   SpO2 100%   BMI 29.27 kg/m  Physical Exam Vitals and nursing note reviewed.  Constitutional:      Appearance: He is well-developed.  HENT:     Head: Normocephalic and atraumatic.  Eyes:     Pupils: Pupils are equal, round, and reactive to light.  Neck:     Vascular: No JVD.  Cardiovascular:     Rate and Rhythm: Normal rate and regular rhythm.     Heart sounds: No murmur heard.    No friction rub. No gallop.  Pulmonary:     Effort:  No respiratory distress.     Breath sounds: No wheezing.  Abdominal:     General: There is no distension.     Tenderness: There is no abdominal tenderness. There is no guarding or rebound.  Musculoskeletal:        General: Normal range of motion.     Cervical back: Normal range of motion and neck supple.  Skin:    Coloration: Skin is not pale.     Findings: No rash.  Neurological:     Mental Status: He is alert and oriented to person, place, and time.     Cranial Nerves: Cranial nerves 2-12 are intact.     Sensory: Sensation is intact.     Motor: Motor function is intact.     Coordination: Coordination is intact.     Comments: Benign neuro exam  Psychiatric:        Behavior: Behavior normal.     ED Results / Procedures / Treatments   Labs (all labs ordered are listed, but only abnormal results  are displayed) Labs Reviewed - No data to display  EKG EKG Interpretation Date/Time:  Thursday June 19 2023 10:06:22 EDT Ventricular Rate:  93 PR Interval:  131 QRS Duration:  77 QT Interval:  362 QTC Calculation: 451 R Axis:   10  Text Interpretation: Sinus rhythm No significant change since last tracing Confirmed by Melene Plan [phone] on 06/19/2023 10:14:27 AM  Radiology VAS [place] LOWER EXTREMITY VENOUS REFLUX Result Date: 06/17/2023  Lower Venous Reflux Study Patient Name:  Francisco Carey  Date of Exam:   06/17/2023 Medical Rec #: [id number]           Accession #:    [id number] Date of Birth: 1986-01-04          Patient Gender: M Patient Age:   37 years Exam Location:  [facility] Procedure:      VAS [place] LOWER EXTREMITY VENOUS REFLUX Referring Phys: Lelon Mast RHYNE --------------------------------------------------------------------------------  Indications: Swelling, and Edema.  Risk Factors: Venous Insufficiency. Performing Technologist: Criss Rosales RVT  Examination Guidelines: A complete evaluation includes B-mode imaging, spectral Doppler, color Doppler, and power Doppler as needed of all accessible portions of each vessel. Bilateral testing is considered an integral part of a complete examination. Limited examinations for reoccurring indications may be performed as noted. The reflux portion of the exam is performed with the patient in reverse Trendelenburg. Significant venous reflux is defined as >500 ms in the superficial venous system, and >1 second in the deep venous system.  Venous Reflux Times +--------------+---------+------+-----------+------------+--------+ RIGHT         Reflux NoRefluxReflux TimeDiameter cmsComments                         Yes                                  +--------------+---------+------+-----------+------------+--------+ Popliteal               yes   >1 second                       +--------------+---------+------+-----------+------------+--------+ GSV at SFJ              yes    >500 ms      0.71             +--------------+---------+------+-----------+------------+--------+ GSV prox thighno  0.34             +--------------+---------+------+-----------+------------+--------+ GSV mid thigh no                            0.34             +--------------+---------+------+-----------+------------+--------+ GSV dist thighno                            0.31             +--------------+---------+------+-----------+------------+--------+ GSV at knee   no                            0.36             +--------------+---------+------+-----------+------------+--------+ GSV prox calf no                            0.34             +--------------+---------+------+-----------+------------+--------+ GSV mid calf            yes    >500 ms      0.41             +--------------+---------+------+-----------+------------+--------+ SSV Pop Fossa no                            0.52             +--------------+---------+------+-----------+------------+--------+ SSV prox calf no                            0.40             +--------------+---------+------+-----------+------------+--------+ SSV mid calf  no                            0.40             +--------------+---------+------+-----------+------------+--------+  Summary: Right: - No evidence of deep vein thrombosis seen in the right lower extremity. - No evidence of superficial venous thrombosis in the right lower extremity. - Venous reflux is noted in the right popliteal vein.  - Venous reflux is noted in the right sapheno-femoral junction. - Venous reflux is noted in the right greater saphenous vein in the calf.  *See table(s) above for measurements and observations. Electronically signed by Heath Lark on 06/17/2023 at 4:43:21 PM.    Final    VAS [place] ABI WITH/WO  TBI Result Date: 06/17/2023  LOWER EXTREMITY DOPPLER STUDY Patient Name:  JAMIE BELGER  Date of Exam:   06/17/2023 Medical Rec #: [id number]           Accession #:    [id number] Date of Birth: [birth year]          Patient Gender: M Patient Age:   37 years Exam Location:  [facility] Procedure:      VAS [place] ABI WITH/WO TBI Referring Phys: Lelon Mast RHYNE --------------------------------------------------------------------------------  Indications: Claudication, and peripheral artery disease. High Risk Factors: Hypertension, Diabetes.  Performing Technologist: Criss Rosales RVT  Examination Guidelines: A complete evaluation includes at minimum, Doppler waveform signals and systolic blood pressure reading at the level of bilateral brachial, anterior tibial, and posterior tibial arteries, when vessel segments  are accessible. Bilateral testing is considered an integral part of a complete examination. Photoelectric Plethysmograph (PPG) waveforms and toe systolic pressure readings are included as required and additional duplex testing as needed. Limited examinations for reoccurring indications may be performed as noted.  ABI Findings: +---------+------------------+-----+-----------+ Right    Rt Pressure (mmHg)IndexWaveform    +---------+------------------+-----+-----------+ Brachial 165                                +---------+------------------+-----+-----------+ PTA      182               1.10 multiphasic +---------+------------------+-----+-----------+ DP       205               1.24 multiphasic +---------+------------------+-----+-----------+ Great Toe143               0.87 Normal      +---------+------------------+-----+-----------+ +---------+------------------+-----+-----------+ Left     Lt Pressure (mmHg)IndexWaveform    +---------+------------------+-----+-----------+ Brachial 161                                +---------+------------------+-----+-----------+  PTA      182               1.10 multiphasic +---------+------------------+-----+-----------+ DP       170               1.03 multiphasic +---------+------------------+-----+-----------+ Great Toe173               1.05 Normal      +---------+------------------+-----+-----------+ +-------+-----------+-----------+ ABI/TBIToday's ABIToday's TBI +-------+-----------+-----------+ Right  1.24       0.87        +-------+-----------+-----------+ Left   1.10       1.05        +-------+-----------+-----------+  Summary: Right: Resting right ankle-brachial index is within normal range. The right toe-brachial index is normal. Left: Resting left ankle-brachial index is within normal range. The left toe-brachial index is normal. *See table(s) above for measurements and observations.  Electronically signed by Heath Lark on 06/17/2023 at 4:43:14 PM.    Final     Procedures .1-3 Lead EKG Interpretation  Performed by: Melene Plan, DO Authorized by: Melene Plan, DO     Interpretation: normal     ECG rate:  90   ECG rate assessment: normal     Rhythm: sinus rhythm     Ectopy: PVCs     Conduction: normal       Medications Ordered in ED Medications - No data to display  ED Course/ Medical Decision Making/ A&P                                 Medical Decision Making  38 yo M with a chief complaints of asymptomatic hypertension.  He was seen in the ED for headache at some points and was found to be hypertensive.  Has since then been started on medication and is following up with his family doctor.  They saw him this weekend he had an increase of his lisinopril.  He felt that things were going well and then he went out drinking last night and when he checked his blood pressure in the morning he felt like it was elevated.  He denies any symptoms currently.  Has benign neurologic exam.  Had a long discussion with the patient and his father.  Encouraged him to continue taking his current dose  of lisinopril.  With recent change I do not feel it should adjust his medications in the ER setting.  Of note the patient also said that when he saw his doctor they noticed that he had a irregular heart rhythm.  He is in normal sinus rhythm here.  He has frequent PVCs on the monitor.  Suspect that is likely the cause of the physical exam findings in the office.  Patient asymptomatic with no noted s/s of end organ damage.  No chest pain, diaphoresis, nausea or other acs symptoms.  No headache or neurologic complaints,  no unequal pulses, normal pulse ox without rales or sob.  Feel this is unlikely to be a Hypertensive Emergency and recent studies suggest no benefit for inpatient admission.  There are also no studies to my knowledge suggesting that patients with hypertensive urgency have increased risk for end organ disease. In fact there has been a study recently that would suggest that the rapid change can induce harm.  The [employer] policy statement on asymptomatic hypertension does not  recommend routing ED medical intervention. The patient will follow up closely with their PCP.  Compliance with their medication stressed.   The management of elevated blood pressure in the acute care setting: a scientific statement from the American Heart Association Bress AP, Jena Gauss, Flack JM, et al. Hypertension. May 2024. doi: 10.1161/HYP.0000000000000238  Chester Holstein, Christell Constant EH, et al. Characteristics and outcomes of patients presenting with hypertensive urgency in the office setting. JAMA Intern Med. 2016 Jul 1; 176(7): 981-8.   Cerebrovascular risks with rapid blood pressure lowering in the absence of hypertensive emergency Laverle Hobby, Ronalee Red, et al. Am J Emerg Med. 2019;37(6):1073-1077.  10:16 AM:  I have discussed the diagnosis/risks/treatment options with the patient and family.  Evaluation and diagnostic testing in the emergency department does not suggest an  emergent condition requiring admission or immediate intervention beyond what has been performed at this time.  They will follow up with PCP. We also discussed returning to the ED immediately if new or worsening sx occur. We discussed the sx which are most concerning (e.g., sudden worsening pain, fever, inability to tolerate by mouth, stroke s/sx) that necessitate immediate return. Medications administered to the patient during their visit and any new prescriptions provided to the patient are listed below.  Medications given during this visit Medications - No data to display   The patient appears reasonably screen and/or stabilized for discharge and I doubt any other medical condition or other [facility] requiring further screening, evaluation, or treatment in the ED at this time prior to discharge.          Final Clinical Impression(s) / ED Diagnoses Final diagnoses:  Asymptomatic hypertension  Uncontrolled hypertension    Rx / DC Orders ED Discharge Orders     None         Melene Plan, DO 06/19/23 1017

## 2023-06-19 NOTE — ED Notes (Signed)

## 2023-06-19 NOTE — ED Triage Notes (Signed)
 Pt presents with concern for HTN, endorses recent med change. Compliant with meds per statement NIH 0

## 2023-06-19 NOTE — Discharge Instructions (Signed)
 As we discussed it takes some time for your blood pressure to show an effect from a change in your medication.  Please return for headache one-sided numbness or weakness difficulty speech or swallowing or if you develop chest pain or difficulty breathing.  I would have you call your family doctor today and just let them know about your visit here and see when they want to see back in the office.

## 2023-06-19 NOTE — ED Notes (Signed)
 ED Provider at bedside.

## 2023-06-25 ENCOUNTER — Emergency Department (HOSPITAL_BASED_OUTPATIENT_CLINIC_OR_DEPARTMENT_OTHER)
Admission: EM | Admit: 2023-06-25 | Discharge: 2023-06-25 | Disposition: A | Discharge status: Home / Self Care | Attending: Student | Admitting: Student

## 2023-06-25 ENCOUNTER — Encounter (HOSPITAL_BASED_OUTPATIENT_CLINIC_OR_DEPARTMENT_OTHER): Payer: Self-pay

## 2023-06-25 ENCOUNTER — Other Ambulatory Visit: Payer: Self-pay

## 2023-06-25 DIAGNOSIS — E119 Type 2 diabetes mellitus without complications: Secondary | ICD-10-CM | POA: Insufficient documentation

## 2023-06-25 DIAGNOSIS — Z794 Long term (current) use of insulin: Secondary | ICD-10-CM | POA: Insufficient documentation

## 2023-06-25 DIAGNOSIS — R059 Cough, unspecified: Secondary | ICD-10-CM | POA: Diagnosis present

## 2023-06-25 DIAGNOSIS — J069 Acute upper respiratory infection, unspecified: Secondary | ICD-10-CM | POA: Diagnosis not present

## 2023-06-25 DIAGNOSIS — I10 Essential (primary) hypertension: Secondary | ICD-10-CM | POA: Diagnosis not present

## 2023-06-25 DIAGNOSIS — Z7984 Long term (current) use of oral hypoglycemic drugs: Secondary | ICD-10-CM | POA: Insufficient documentation

## 2023-06-25 DIAGNOSIS — Z7982 Long term (current) use of aspirin: Secondary | ICD-10-CM | POA: Insufficient documentation

## 2023-06-25 DIAGNOSIS — Z79899 Other long term (current) drug therapy: Secondary | ICD-10-CM | POA: Insufficient documentation

## 2023-06-25 LAB — RESP PANEL BY RT-PCR (RSV, FLU A&B, COVID)  RVPGX2
Influenza A by PCR: NEGATIVE
Influenza B by PCR: NEGATIVE
Resp Syncytial Virus by PCR: NEGATIVE
SARS Coronavirus 2 by RT PCR: NEGATIVE

## 2023-06-25 MED ORDER — PROMETHAZINE-DM 6.25-15 MG/5ML PO SYRP: 2.5000 mL | ORAL_SOLUTION | Freq: Four times a day (QID) | ORAL | 0 refills | Status: DC | PRN

## 2023-06-25 NOTE — ED Notes (Signed)
 Provider aware of the BP being elevated for DC... Provider cleared DC.Marland KitchenMarland Kitchen

## 2023-06-25 NOTE — ED Triage Notes (Signed)
 States having body aches ear aches  cough and congestion for several days.  BP high   States did not take BP meds this am

## 2023-06-25 NOTE — Discharge Instructions (Addendum)
 You have been seen today for your complaint of upper respiratory tract infection. Your lab work was negative for flu, COVID, RSV today. Your discharge medications include Tylenol.  You may take up to 1000 mg of Tylenol every 6 hours as needed for pain and fevers.  Avoid NSAIDs as these may range her blood pressure. Take Claritin and Flonase for your congestion and runny nose.  These are over-the-counter medications.  These may also help with your cough. Take the promethazine-dextromethorphan for your cough as needed. Home care instructions are as follows:  Drink plenty of water.  Follow the DASH diet Follow up with: Your primary care provider in 1 week Please seek immediate medical care if you develop any of the following symptoms: You have shortness of breath that gets worse. You have severe or persistent: Headache. Ear pain. Sinus pain. Chest pain. You have chronic lung disease along with any of the following: Making high-pitched whistling sounds when you breathe, most often when you breathe out (wheezing). Prolonged cough (more than 14 days). Coughing up blood. A change in your usual mucus. You have a stiff neck. You have changes in your: Vision. Hearing. Thinking. Mood. At this time there does not appear to be the presence of an emergent medical condition, however there is always the potential for conditions to change. Please read and follow the below instructions.  Do not take your medicine if  develop an itchy rash, swelling in your mouth or lips, or difficulty breathing; call 911 and seek immediate emergency medical attention if this occurs.  You may review your lab tests and imaging results in their entirety on your MyChart account.  Please discuss all results of fully with your primary care provider and other specialist at your follow-up visit.  Note: Portions of this text may have been transcribed using voice recognition software. Every effort was made to ensure accuracy;  however, inadvertent computerized transcription errors may still be present.

## 2023-06-25 NOTE — ED Notes (Signed)
 Discharge paperwork given and verbally understood.

## 2023-06-25 NOTE — ED Provider Notes (Signed)
 [facility] AT [facility] Provider Note   CSN: [id number] Arrival date & time: 06/25/23  6295     History  Chief Complaint  Patient presents with   Influenza    Francisco Carey is a 38 y.o. male.  With history of hypertension, NIDDM 2 presenting to the ED for evaluation of flulike symptoms.  Symptoms include cough, congestion, rhinorrhea, body aches, ear pain, sore throat.  Symptoms began approximately 4 days ago.  No known sick contacts.  No fevers.  Cough is nonproductive.  No chest pain, shortness of breath or abdominal pain.  Patient was recently diagnosed with hypertension.  States he has been taking his blood pressure medicines but has not taken them today.   Influenza Presenting symptoms: cough, myalgias and rhinorrhea   Associated symptoms: ear pain and nasal congestion        Home Medications Prior to Admission medications   Medication Sig Start Date End Date Taking? Authorizing Provider  promethazine-dextromethorphan (PROMETHAZINE-DM) 6.25-15 MG/5ML syrup Take 2.5 mLs by mouth 4 (four) times daily as needed for cough. 06/25/23  Yes Terin Cragle, Edsel Petrin, PA-C  amLODipine (NORVASC) 5 MG tablet Take 2 tablets (10 mg total) by mouth daily. 05/29/23   Gailen Shelter, PA  aspirin EC 81 MG tablet Take 81 mg by mouth daily.    [provider]  ezetimibe (ZETIA) 10 MG tablet Take 10 mg by mouth daily.    [provider]  FARXIGA 5 MG TABS tablet Take 5 mg by mouth daily.    [provider]  glipiZIDE (GLUCOTROL) 5 MG tablet Take 1 tablet (5 mg total) by mouth daily before breakfast. Patient taking differently: Take 1 mg by mouth daily before breakfast. 03/26/23   Al Decant, PA-C  insulin detemir (LEVEMIR) 100 UNIT/ML injection Inject 0.1 mLs (10 Units total) into the skin at bedtime. Patient not taking: Reported on 01/19/2020 11/21/17   Claude Manges, PA-C  lisinopril (ZESTRIL) 10 MG tablet Take 1 tablet (10 mg  total) by mouth daily. 06/01/23   Gilda Crease, MD  losartan (COZAAR) 50 MG tablet Take 1 tablet (50 mg total) by mouth daily. 06/01/23   Gilda Crease, MD  metFORMIN (GLUCOPHAGE) 1000 MG tablet Take 1 tablet (1,000 mg total) by mouth 2 (two) times daily. 11/01/20   Theron Arista, PA-C  ondansetron (ZOFRAN) 4 MG tablet Take 1 tablet (4 mg total) by mouth every 6 (six) hours as needed for nausea or vomiting. 05/21/22   Peter Garter, PA  oseltamivir (TAMIFLU) 75 MG capsule Take 1 capsule (75 mg total) by mouth every 12 (twelve) hours. 05/21/22   Peter Garter, PA  sildenafil (VIAGRA) 50 MG tablet Take 1 tablet (50 mg total) by mouth daily as needed for erectile dysfunction. 03/26/23   Al Decant, PA-C      Allergies    Patient has no known allergies.    Review of Systems   Review of Systems  HENT:  Positive for congestion, ear pain and rhinorrhea.   Respiratory:  Positive for cough.   Musculoskeletal:  Positive for myalgias.  All other systems reviewed and are negative.   Physical Exam Updated Vital Signs BP (!) 175/122 (BP Location: Right Arm)   Pulse 100   Temp 98.4 F (36.9 C)   Resp 18   Ht 6\' 2"  (1.88 m)   Wt 102.1 kg   SpO2 98%   BMI 28.89 kg/m  Physical Exam Vitals and nursing  note (Resting comfortably in bed) reviewed.  Constitutional:      General: He is not in acute distress.    Appearance: Normal appearance. He is normal weight. He is not ill-appearing.  HENT:     Head: Normocephalic and atraumatic.     Ears:     Comments: Bilateral cerumen impaction    Mouth/Throat:     Mouth: Mucous membranes are moist.     Pharynx: Oropharynx is clear. No oropharyngeal exudate or posterior oropharyngeal erythema.  Cardiovascular:     Rate and Rhythm: Normal rate and regular rhythm.  Pulmonary:     Effort: Pulmonary effort is normal. No respiratory distress.     Breath sounds: No wheezing, rhonchi or rales.  Abdominal:     General: Abdomen is  flat.  Musculoskeletal:        General: Normal range of motion.     Cervical back: Neck supple.  Skin:    General: Skin is warm and dry.  Neurological:     Mental Status: He is alert and oriented to person, place, and time.  Psychiatric:        Mood and Affect: Mood normal.        Behavior: Behavior normal.     ED Results / Procedures / Treatments   Labs (all labs ordered are listed, but only abnormal results are displayed) Labs Reviewed  RESP PANEL BY RT-PCR (RSV, FLU A&B, COVID)  [id number]    EKG None  Radiology No results found.  Procedures Procedures    Medications Ordered in ED Medications - No data to display  ED Course/ Medical Decision Making/ A&P                                 Medical Decision Making This patient presents to the ED for concern of flulike symptoms, this involves an extensive number of treatment options, and is a complaint that carries with it a high risk of complications and morbidity.  The differential diagnosis includes flu, COVID, RSV, other viral URI  My initial workup includes arthrogram  Additional history obtained from: Nursing notes from this visit.  I ordered, reviewed and interpreted labs which include: Respiratory panel negative  Afebrile, hypertensive but otherwise hemodynamically stable.  38 year old male presenting to the ED for evaluation of flulike symptoms.  Symptoms began approximately 4 days ago.  No fevers.  Respiratory panel here negative.  Constellation of symptoms is consistent with a viral upper respiratory tract infection.  He appears very well on physical exam.  No adventitious breath sounds, lower suspicion for pneumonia.  Bilateral cerumen impaction, this may be contributing to his ear pain.  He states he typically cleans his ears by flushing them.  He was encouraged to do this today.  Otitis media is very uncommon in this age group.  He was educated on typical timeline of symptoms and supportive care.  He was sent a  prescription for promethazine dextromethorphan cough syrup.  He was encouraged to stay away from medications that may increase blood pressure such as pseudoephedrine and NSAIDs.  He states he has been compliant with his antihypertensives.  He has not taken his antihypertensive today but states he will take it when he gets home.  He was encouraged to follow-up with his primary care provider.  He was given return precautions.  Stable at discharge.  At this time there does not appear to be any evidence of an acute  emergency medical condition and the patient appears stable for discharge with appropriate outpatient follow up. Diagnosis was discussed with patient who verbalizes understanding of care plan and is agreeable to discharge. I have discussed return precautions with patient who verbalizes understanding. Patient encouraged to follow-up with their PCP within 1 week. All questions answered.  Note: Portions of this report may have been transcribed using voice recognition software. Every effort was made to ensure accuracy; however, inadvertent computerized transcription errors may still be present.        Final Clinical Impression(s) / ED Diagnoses Final diagnoses:  Viral URI  Uncontrolled hypertension    Rx / DC Orders ED Discharge Orders          Ordered    promethazine-dextromethorphan (PROMETHAZINE-DM) 6.25-15 MG/5ML syrup  4 times daily PRN        06/25/23 0954              Michelle Piper, PA-C 06/25/23 1610    Glendora Score, MD 06/25/23 1840

## 2023-07-10 ENCOUNTER — Ambulatory Visit (INDEPENDENT_AMBULATORY_CARE_PROVIDER_SITE_OTHER): Admitting: Physician Assistant

## 2023-07-10 ENCOUNTER — Encounter: Payer: Self-pay | Admitting: Physician Assistant

## 2023-07-10 VITALS — BP 180/127 | HR 100 | Temp 98.0°F | Ht 74.0 in | Wt 219.1 lb

## 2023-07-10 DIAGNOSIS — M7989 Other specified soft tissue disorders: Secondary | ICD-10-CM

## 2023-07-10 DIAGNOSIS — I872 Venous insufficiency (chronic) (peripheral): Secondary | ICD-10-CM

## 2023-07-10 NOTE — Progress Notes (Unsigned)
 Requested by:  Norm Salt, PA [address] [place],  [place] [address]  Reason for consultation: bilateral leg swelling    History of Present Illness   Francisco Carey is a 38 y.o. (03/23/1986) male who presents for evaluation of bilateral lower extremity swelling.  He states over the past couple of months he has noticed new issues with bilateral leg swelling, from the ankle to the knee.  His swelling is equal on both sides.  He notes that his swelling is typically worse later in the day, especially after prolonged exercise or sitting.  This causes his legs to feel heavy, tight, and tired.  He does not elevate his legs or wear compression stockings.  He also notes for the past couple of years he has been experiencing intermittent tingling and burning sensations in his feet, lower legs, and sometimes in his left arm.  His tingling/burning in the legs is usually worse after prolonged sitting.  He has no prior history of DVT or vein procedures.  He denies any claudication, rest pain, or tissue loss.  Past Medical History:  Diagnosis Date   Diabetes mellitus without complication (HCC)    Hypertension     Past Surgical History:  Procedure Laterality Date   KNEE SURGERY     KNEE SURGERY      Social History   Socioeconomic History   Marital status: Single    Spouse name: Not on file   Number of children: Not on file   Years of education: Not on file   Highest education level: Not on file  Occupational History   Not on file  Tobacco Use   Smoking status: Never   Smokeless tobacco: Never  Vaping Use   Vaping status: Never Used  Substance and Sexual Activity   Alcohol use: Yes    Comment: occ   Drug use: No   Sexual activity: Not on file  Other Topics Concern   Not on file  Social History Narrative   Not on file   Social Drivers of Health   Financial Resource Strain: Not on file  Food Insecurity: Not on file  Transportation Needs: Not on file  Physical  Activity: Not on file  Stress: Not on file  Social Connections: Not on file  Intimate Partner Violence: Not on file   No family history on file.  Current Outpatient Medications  Medication Sig Dispense Refill   amLODipine (NORVASC) 5 MG tablet Take 2 tablets (10 mg total) by mouth daily. 30 tablet 0   aspirin EC 81 MG tablet Take 81 mg by mouth daily.     ezetimibe (ZETIA) 10 MG tablet Take 10 mg by mouth daily.     FARXIGA 5 MG TABS tablet Take 5 mg by mouth daily.     glipiZIDE (GLUCOTROL) 5 MG tablet Take 1 tablet (5 mg total) by mouth daily before breakfast. (Patient taking differently: Take 1 mg by mouth daily before breakfast.) 30 tablet 0   insulin detemir (LEVEMIR) 100 UNIT/ML injection Inject 0.1 mLs (10 Units total) into the skin at bedtime. (Patient not taking: Reported on 01/19/2020) 3 mL 30   lisinopril (ZESTRIL) 10 MG tablet Take 1 tablet (10 mg total) by mouth daily. 30 tablet 3   losartan (COZAAR) 50 MG tablet Take 1 tablet (50 mg total) by mouth daily. 30 tablet 2   metFORMIN (GLUCOPHAGE) 1000 MG tablet Take 1 tablet (1,000 mg total) by mouth 2 (two) times daily. 180 tablet 2  ondansetron (ZOFRAN) 4 MG tablet Take 1 tablet (4 mg total) by mouth every 6 (six) hours as needed for nausea or vomiting. 12 tablet 0   oseltamivir (TAMIFLU) 75 MG capsule Take 1 capsule (75 mg total) by mouth every 12 (twelve) hours. 10 capsule 0   promethazine-dextromethorphan (PROMETHAZINE-DM) 6.25-15 MG/5ML syrup Take 2.5 mLs by mouth 4 (four) times daily as needed for cough. 118 mL 0   sildenafil (VIAGRA) 50 MG tablet Take 1 tablet (50 mg total) by mouth daily as needed for erectile dysfunction. 20 tablet 0   No current facility-administered medications for this visit.    No Known Allergies  REVIEW OF SYSTEMS (negative unless checked):   Cardiac:  []  Chest pain or chest pressure? []  Shortness of breath upon activity? []  Shortness of breath when lying flat? []  Irregular heart  rhythm?  Vascular:  []  Pain in calf, thigh, or hip brought on by walking? []  Pain in feet at night that wakes you up from your sleep? []  Blood clot in your veins? [x]  Leg swelling?  Pulmonary:  []  Oxygen at home? []  Productive cough? []  Wheezing?  Neurologic:  []  Sudden weakness in arms or legs? []  Sudden numbness in arms or legs? []  Sudden onset of difficult speaking or slurred speech? []  Temporary loss of vision in one eye? []  Problems with dizziness?  Gastrointestinal:  []  Blood in stool? []  Vomited blood?  Genitourinary:  []  Burning when urinating? []  Blood in urine?  Psychiatric:  []  Major depression  Hematologic:  []  Bleeding problems? []  Problems with blood clotting?  Dermatologic:  []  Rashes or ulcers?  Constitutional:  []  Fever or chills?  Ear/Nose/Throat:  []  Change in hearing? []  Nose bleeds? []  Sore throat?  Musculoskeletal:  []  Back pain? []  Joint pain? []  Muscle pain?   Physical Examination     Vitals:   07/10/23 0831  BP: (!) 180/127  Pulse: 100  Temp: 98 F (36.7 C)  TempSrc: Temporal  SpO2: 97%  Weight: 219 lb 1.6 oz (99.4 kg)  Height: 6\' 2"  (1.88 m)   Body mass index is 28.13 kg/m.  General:  WDWN in NAD; vital signs documented above Gait: Not observed HENT: WNL, normocephalic Pulmonary: normal non-labored breathing , without Rales, rhonchi,  wheezing Cardiac: regular Abdomen: soft, NT, no masses Skin: without rashes Vascular Exam/Pulses: 2+ DP pulses Extremities: BLE without varicose veins, without reticular veins, with minimal edema, without stasis pigmentation, without lipodermatosclerosis, without ulcers Musculoskeletal: no muscle wasting or atrophy  Neurologic: A&O X 3;  No focal weakness or paresthesias are detected Psychiatric:  The pt has Normal affect.  Non-invasive Vascular Imaging   RLE Venous Insufficiency Duplex (06/17/2023):  +--------------+---------+------+-----------+------------+--------+  RIGHT         Reflux NoRefluxReflux TimeDiameter cmsComments                          Yes                                   +--------------+---------+------+-----------+------------+--------+  Popliteal              yes   >1 second                       +--------------+---------+------+-----------+------------+--------+  GSV at SFJ              yes    >500  ms      0.71              +--------------+---------+------+-----------+------------+--------+  GSV prox thighno                            0.34              +--------------+---------+------+-----------+------------+--------+  GSV mid thigh no                            0.34              +--------------+---------+------+-----------+------------+--------+  GSV dist thighno                            0.31              +--------------+---------+------+-----------+------------+--------+  GSV at knee   no                            0.36              +--------------+---------+------+-----------+------------+--------+  GSV prox calf no                            0.34              +--------------+---------+------+-----------+------------+--------+  GSV mid calf            yes    >500 ms      0.41              +--------------+---------+------+-----------+------------+--------+  SSV Pop Fossa no                            0.52              +--------------+---------+------+-----------+------------+--------+  SSV prox calf no                            0.40              +--------------+---------+------+-----------+------------+--------+  SSV mid calf  no                            0.40                ABIs (06/17/2023) +---------+------------------+-----+-----------+  Right   Rt Pressure (mmHg)IndexWaveform     +---------+------------------+-----+-----------+  Brachial 165                                 +---------+------------------+-----+-----------+   PTA     182               1.10 multiphasic  +---------+------------------+-----+-----------+  DP      205               1.24 multiphasic  +---------+------------------+-----+-----------+  Great Toe143               0.87 Normal       +---------+------------------+-----+-----------+   +---------+------------------+-----+-----------+  Left    Lt Pressure (mmHg)IndexWaveform     +---------+------------------+-----+-----------+  Brachial 161                                 +---------+------------------+-----+-----------+  PTA     182               1.10 multiphasic  +---------+------------------+-----+-----------+  DP      170               1.03 multiphasic  +---------+------------------+-----+-----------+  Great Toe173               1.05 Normal       +---------+------------------+-----+-----------+   +-------+-----------+-----------+  ABI/TBIToday's ABIToday's TBI  +-------+-----------+-----------+  Right 1.24       0.87         +-------+-----------+-----------+  Left  1.10       1.05         +-------+-----------+-----------+    Medical Decision Making   JOWEL WALTNER is a 38 y.o. male who presents for evaluation of bilateral leg swelling  Based on the patient's duplex, there is reflux in the right popliteal vein and greater saphenous vein at the saphenofemoral junction and mid calf.  The remainder of the deep and superficial venous system is competent.  There is no evidence of DVT or SVT on exam.  He would not be a candidate for saphenous vein ablation given that it is competent throughout most of the thigh He describes a several month history of intermittent bilateral lower extremity swelling.  His swelling is usually just around his ankles, and sometimes extends into his calves.  This makes his legs feel heavy and tired.  Also for the past couple of years he has been having intermittent issues with burning/tingling in his legs  after prolonged sitting On exam he has minimal edema of bilateral lower extremities.  He has no stasis pigmentation or varicose veins.  He has no ulcerations.  He has 2+ pedal pulses I have explained to the patient that his lower extremity swelling could be due to underlying venous insufficiency.  This can be well-controlled with conservative therapy, including avoiding prolonged sitting and standing, wearing compression stockings daily, and elevating the legs above the heart daily for at least 10 to 15 minutes.  In regards to his leg tingling/burning sensations, this does not sound venous or arterial nature.  Potentially he is dealing with some neuropathic sensations due to his underlying diabetes or potential spine disease.  His ABIs are also normal and he has palpable pedal pulses I have encouraged the patient that his leg swelling can be well-controlled with conservative therapy.  He was measured for and given a pair of 15 to 20 mmHg knee-high compression stockings.  He can follow-up with our office as needed   Ernestene Mention, PA-C [facility] Office: [phone]  07/10/2023, 8:26 AM  Clinic MD: Karin Lieu

## 2023-08-22 ENCOUNTER — Encounter: Payer: Self-pay | Admitting: Cardiology

## 2023-08-22 ENCOUNTER — Ambulatory Visit: Attending: Cardiology | Admitting: Cardiology

## 2023-08-22 VITALS — BP 174/118 | HR 85 | Ht 74.0 in | Wt 234.0 lb

## 2023-08-22 DIAGNOSIS — I493 Ventricular premature depolarization: Secondary | ICD-10-CM

## 2023-08-22 DIAGNOSIS — E782 Mixed hyperlipidemia: Secondary | ICD-10-CM | POA: Diagnosis not present

## 2023-08-22 DIAGNOSIS — R0683 Snoring: Secondary | ICD-10-CM

## 2023-08-22 DIAGNOSIS — I1A0 Resistant hypertension: Secondary | ICD-10-CM | POA: Diagnosis not present

## 2023-08-22 DIAGNOSIS — E1165 Type 2 diabetes mellitus with hyperglycemia: Secondary | ICD-10-CM

## 2023-08-22 MED ORDER — AMLODIPINE BESYLATE 10 MG PO TABS
10.0000 mg | ORAL_TABLET | Freq: Every day | ORAL | 3 refills | Status: DC
Start: 2023-08-22 — End: 2023-08-22

## 2023-08-22 MED ORDER — LABETALOL HCL 200 MG PO TABS: 200.0000 mg | ORAL_TABLET | Freq: Two times a day (BID) | ORAL | 3 refills | Status: DC

## 2023-08-22 MED ORDER — CLONIDINE HCL 0.2 MG PO TABS: 0.2000 mg | ORAL_TABLET | Freq: Two times a day (BID) | ORAL | 3 refills | Status: DC

## 2023-08-22 NOTE — Patient Instructions (Addendum)
 Medication Instructions:  Your physician has recommended you make the following change in your medication:   1) START clonidine  0.2 mg twice daily 2) START labetalol  200 mg twice daily 3) STOP losartan  50 mg, continue taking losartan -hydrochlorothiazide  *If you need a refill on your cardiac medications before your next appointment, please call your pharmacy*  Testing/Procedures: Your physician has requested that you have an echocardiogram. Echocardiography is a painless test that uses sound waves to create images of your heart. It provides your doctor with information about the size and shape of your heart and how well your heart's chambers and valves are working. This procedure takes approximately one hour. There are no restrictions for this procedure. Please do NOT wear cologne, perfume, aftershave, or lotions (deodorant is allowed). Please arrive 15 minutes prior to your appointment time.  Please note: We ask at that you not bring children with you during ultrasound (echo/ vascular) testing. Due to room size and safety concerns, children are not allowed in the ultrasound rooms during exams. Our front office staff cannot provide observation of children in our lobby area while testing is being conducted. An adult accompanying a patient to their appointment will only be allowed in the ultrasound room at the discretion of the ultrasound technician under special circumstances. We apologize for any inconvenience.  Your physician has recommended that you have a sleep study. This test records several body functions during sleep, including: brain activity, eye movement, oxygen and carbon dioxide blood levels, heart rate and rhythm, breathing rate and rhythm, the flow of air through your mouth and nose, snoring, body muscle movements, and chest and belly movement. This will be arranged through [facility].  Follow-Up: At [facility], you and your health needs are our priority.  As  part of our continuing mission to provide you with exceptional heart care, our providers are all part of one team.  This team includes your primary Cardiologist (physician) and Advanced Practice Providers or APPs (Physician Assistants and Nurse Practitioners) who all work together to provide you with the care you need, when you need it.  Your next appointment:   2-3 week(s)  The format for your next appointment:   In Person  Provider:   Lawana Pray, NP, Graciela Lava, PA-C, Palmer Bobo, NP, Sharren Decree, PA-C, Liane Redman, PA-C, Theotis Flake, PA-C, Marlana Silvan, NP, Marlyse Single, PA-C, Katlyn West, NP, or Leala Prince, PA-C      Then, Knox Perl, MD will plan to see you again in 2 month(s).{  We recommend signing up for the patient portal called "MyChart".  Sign up information is provided on this After Visit Summary.  MyChart is used to connect with patients for Virtual Visits (Telemedicine).  Patients are able to view lab/test results, encounter notes, upcoming appointments, etc.  Non-urgent messages can be sent to your provider as well.   To learn more about what you can do with MyChart, go to [email or web address].   Other Instructions You have been referred to Dr. Maximo Spar (here at our office) to discuss cholesterol management.   You have also been referred to: [facility] [address] [place],  [place]  [address]  Main: [phone]

## 2023-08-22 NOTE — Progress Notes (Signed)
 Cardiology Office Note:  .   Date:  08/22/2023  ID:  Francisco Carey, DOB 07-Aug-1985, MRN [id number] PCP: Dianah Fort, PA  [facility] Providers Cardiologist:  Knox Perl, MD   History of Present Illness: .   Francisco Carey is a 38 y.o. AAM patient with uncontrolled HTN, DM, Mixed hyperlipidemia, referred to me for management of the hypertension and hyperlipidemia and cardiac risk stratification.   Discussed the use of AI scribe software for clinical note transcription with the patient, who gave verbal consent to proceed.  History of Present Illness Francisco Carey is a 38 year old male with hypertension and diabetes who presents with elevated blood pressure and concerns about heart rhythm. He was referred by his primary care doctor for evaluation of elevated blood pressure and heart rhythm concerns.  He experiences elevated blood pressure with fluctuations throughout the day, despite efforts to manage it through reduced sodium intake, limited alcohol consumption, and increased physical activity. 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  He has noticed an extra heart rhythm on his EKG, present for a long time, and occasionally perceives these extra beats, especially when lying down, without acute symptoms. He manages type 2 diabetes with satisfactory results, achieving significant weight loss from 285 pounds to 234 pounds, with stable weight in recent years.  He has a history of hyperlipidemia, with a total cholesterol level of 600 mg/dL in 4098 and 119 mg/dL in February 2025, and triglycerides at 746 mg/dL. He is working on dietary improvements.  He occasionally snores, raising suspicion for obstructive sleep apnea.  Labs    Lab Results  Component Value Date   NA 137 05/29/2023   K 3.8 05/29/2023   CO2 28 05/29/2023   GLUCOSE 210 (H) 05/29/2023    BUN 22 (H) 05/29/2023   CREATININE 1.22 05/29/2023   CALCIUM 8.6 (L) 05/29/2023   EGFR 71.0 05/21/2023   GFRNONAA >60 05/29/2023      Latest Ref Rng & Units 05/29/2023   11:07 AM 09/20/2022    2:50 PM 11/01/2020   10:08 AM  BMP  Glucose 70 - 99 mg/dL 147  829  562   BUN 6 - 20 mg/dL 22  19  16    Creatinine 0.61 - 1.24 mg/dL 1.30  8.65  7.84   Sodium 135 - 145 mmol/L 137  137  136   Potassium 3.5 - 5.1 mmol/L 3.8  3.7  4.6   Chloride 98 - 111 mmol/L 101  101  105   CO2 22 - 32 mmol/L 28  24  25    Calcium 8.9 - 10.3 mg/dL 8.6  9.5  9.5       Latest Ref Rng & Units 05/29/2023   11:07 AM 09/20/2022    2:50 PM 11/01/2020   10:08 AM  CBC  WBC 4.0 - 10.5 K/uL 8.0  11.2  6.6   Hemoglobin 13.0 - 17.0 g/dL 69.6  29.5  28.4   Hematocrit 39.0 - 52.0 % 42.5  42.9  44.4   Platelets 150 - 400 K/uL 353  321  309    Lab Results  Component Value Date   CHOL 598 (H) 02/16/2012   HDL NOT REPORTED DUE TO HIGH TRIGLYCERIDES 02/16/2012   LDLCALC UNABLE TO CALCULATE IF TRIGLYCERIDE OVER 400 mg/dL 13/24/4010   TRIG 2,725 (H) 02/16/2012   CHOLHDL NOT REPORTED DUE TO HIGH  TRIGLYCERIDES 02/16/2012    External Labs:  PCP faxed labs 05/16/2022:  Total cholesterol 339, triglycerides 975, HDL 51.  Non-HDL cholesterol 288.  A1c 8.6%.  TSH normal at 1.76.  PCP faxed lab 05/21/2023:  A1c 9.5%.  TSH mildly elevated at 6.94, free T4 normal at 1.1.  Total cholesterol 360, triglycerides 746, HDL 60.  Non-HDL cholesterol 300.  Serum glucose 309 mg, BUN 29, creatinine 1.33, EGFR 71 mL, potassium 4.3.  ROS  Review of Systems  Cardiovascular:  Negative for chest pain, dyspnea on exertion and leg swelling.    Physical Exam:   VS:  BP (!) 174/118 (BP Location: Right Arm, Patient Position: Sitting, Cuff Size: Large)   Pulse 85   Ht 6\' 2"  (1.88 m)   Wt 234 lb (106.1 kg)   SpO2 99%   BMI 30.04 kg/m    Wt Readings from Last 3 Encounters:  08/22/23 234 lb (106.1 kg)  07/10/23 219 lb 1.6 oz (99.4 kg)   06/25/23 225 lb (102.1 kg)    Physical Exam Neck:     Vascular: No carotid bruit or JVD.  Cardiovascular:     Rate and Rhythm: Normal rate and regular rhythm.     Pulses: Intact distal pulses.     Heart sounds: Normal heart sounds. No murmur heard.    No gallop.  Pulmonary:     Effort: Pulmonary effort is normal.     Breath sounds: Normal breath sounds.  Abdominal:     General: Bowel sounds are normal.     Palpations: Abdomen is soft.  Musculoskeletal:     Right lower leg: No edema.     Left lower leg: No edema.    Studies Reviewed: Aaron Aas     EKG:    EKG Interpretation Date/Time:  Friday Aug 22 2023 08:39:03 EDT Ventricular Rate:  85 PR Interval:  130 QRS Duration:  76 QT Interval:  382 QTC Calculation: 454 R Axis:   0  Text Interpretation: EKG 08/22/2023: Normal sinus rhythm at rate of 85 bpm with frequent unifocal PVCs (3).  Compared to 06/19/2023, PVCs     Confirmed by Lucette Kratz, Jagadeesh ([id number]) on 08/22/2023 8:52:06 AM    Medications and allergies    No Known Allergies   Current Outpatient Medications:    cloNIDine  (CATAPRES ) 0.2 MG tablet, Take 1 tablet (0.2 mg total) by mouth 2 (two) times daily., Disp: 60 tablet, Rfl: 3   FARXIGA 5 MG TABS tablet, Take 5 mg by mouth daily., Disp: , Rfl:    glimepiride (AMARYL) 1 MG tablet, Take 5 mg by mouth daily with breakfast., Disp: , Rfl:    labetalol  (NORMODYNE ) 200 MG tablet, Take 1 tablet (200 mg total) by mouth 2 (two) times daily., Disp: 60 tablet, Rfl: 3   losartan -hydrochlorothiazide (HYZAAR) 100-25 MG tablet, Take 1 tablet by mouth daily., Disp: , Rfl:    sildenafil  (VIAGRA ) 50 MG tablet, Take 1 tablet (50 mg total) by mouth daily as needed for erectile dysfunction., Disp: 20 tablet, Rfl: 0   aspirin  EC 81 MG tablet, Take 81 mg by mouth daily. (Patient not taking: Reported on 07/10/2023), Disp: , Rfl:    ezetimibe (ZETIA) 10 MG tablet, Take 10 mg by mouth daily. (Patient not taking: Reported on 08/22/2023), Disp: , Rfl:     glipiZIDE  (GLUCOTROL ) 5 MG tablet, Take 1 tablet (5 mg total) by mouth daily before breakfast. (Patient not taking: Reported on 08/22/2023), Disp: 30 tablet, Rfl: 0   insulin  detemir (LEVEMIR ) 100  UNIT/ML injection, Inject 0.1 mLs (10 Units total) into the skin at bedtime. (Patient not taking: Reported on 01/19/2020), Disp: 3 mL, Rfl: 30   lisinopril  (ZESTRIL ) 10 MG tablet, Take 1 tablet (10 mg total) by mouth daily. (Patient not taking: Reported on 08/22/2023), Disp: 30 tablet, Rfl: 3   metFORMIN  (GLUCOPHAGE ) 1000 MG tablet, Take 1 tablet (1,000 mg total) by mouth 2 (two) times daily. (Patient not taking: Reported on 08/22/2023), Disp: 180 tablet, Rfl: 2   ondansetron  (ZOFRAN ) 4 MG tablet, Take 1 tablet (4 mg total) by mouth every 6 (six) hours as needed for nausea or vomiting. (Patient not taking: Reported on 08/22/2023), Disp: 12 tablet, Rfl: 0   promethazine -dextromethorphan (PROMETHAZINE -DM) 6.25-15 MG/5ML syrup, Take 2.5 mLs by mouth 4 (four) times daily as needed for cough. (Patient not taking: Reported on 08/22/2023), Disp: 118 mL, Rfl: 0   Meds ordered this encounter  Medications   DISCONTD: amLODipine  (NORVASC ) 10 MG tablet    Sig: Take 1 tablet (10 mg total) by mouth daily.    Dispense:  180 tablet    Refill:  3    Dose change (increased on 08/22/23).   labetalol  (NORMODYNE ) 200 MG tablet    Sig: Take 1 tablet (200 mg total) by mouth 2 (two) times daily.    Dispense:  60 tablet    Refill:  3   cloNIDine  (CATAPRES ) 0.2 MG tablet    Sig: Take 1 tablet (0.2 mg total) by mouth 2 (two) times daily.    Dispense:  60 tablet    Refill:  3    Discontinued amlodpine, starting clonidine  instead 08/22/23.     Medications Discontinued During This Encounter  Medication Reason   amLODipine  (NORVASC ) 5 MG tablet Dose change   losartan  (COZAAR ) 50 MG tablet Discontinued by provider   amLODipine  (NORVASC ) 10 MG tablet Discontinued by provider     ASSESSMENT AND PLAN: .      ICD-10-CM   1.  Resistant hypertension  I1A.0 ECHOCARDIOGRAM COMPLETE    2. Frequent PVCs  I49.3 ECHOCARDIOGRAM COMPLETE    3. Mixed hyperlipidemia  E78.2 EKG 12-Lead    AMB Referral to [facility]    4. Type 2 diabetes mellitus with hyperglycemia, without long-term current use of insulin  (HCC)  E11.65 EKG 12-Lead    Ambulatory referral to Endocrinology    CANCELED: Ambulatory referral to Endocrinology    5. Loud snoring  R06.83 Home sleep test      Assessment and Plan Assessment & Plan Hypertension   He has hypertension with elevated blood pressure, increasing his risk for cardiovascular events like myocardial infarction or cerebrovascular accident within five years if not managed. Lifestyle modifications, including dietary changes and increased physical activity, are being implemented.  At clonidine  0.2 mg p.o. twice daily and initiate labetalol  200 mg p.o. twice daily. Schedule follow-up for blood pressure management in 2-3 weeks.  Hyperlipidemia   Severe hyperlipidemia with extremely high cholesterol levels, including a total cholesterol of 600 in 2013 and 360 in February 2025. Triglycerides are significantly elevated at 746, placing him at high risk for cardiovascular complications. Refer to a Dr. [place] Hilty for cholesterol management.  Palpitations   He experiences intermittent palpitations with occasional awareness of extra heartbeats. EKG shows PVCs, but he does not consistently feel them. No acute symptoms reported during the visit.   Type 2 Diabetes Mellitus   Long-standing Type 2 Diabetes Mellitus is poorly controlled, necessitating urgent endocrinology referral. He has been managing diabetes  with some success, but further specialist input is required. Refer to endocrinology for urgent appointment.  Will obtain echocardiogram, he will need very close monitoring of his significant cardiovascular risk, patient is presently 38 years of age with greater than a decade of  uncontrolled risk factors.  Fortunately patient also has made significant lifestyle changes and he now appears to be motivated in taking care of his medical issues seriously. I have reviewed his records extensively including prior hospitalization and ED visits and also PCP labs and PCP visits.   Signed,  Knox Perl, MD, [facility] 08/22/2023, 8:13 PM [facility] [address] [place], [place] [address] Phone: [phone]. Fax:  [phone]

## 2023-08-27 ENCOUNTER — Emergency Department (HOSPITAL_BASED_OUTPATIENT_CLINIC_OR_DEPARTMENT_OTHER)

## 2023-08-27 ENCOUNTER — Encounter (HOSPITAL_BASED_OUTPATIENT_CLINIC_OR_DEPARTMENT_OTHER): Payer: Self-pay

## 2023-08-27 ENCOUNTER — Inpatient Hospital Stay (HOSPITAL_BASED_OUTPATIENT_CLINIC_OR_DEPARTMENT_OTHER)
Admission: EM | Admit: 2023-08-27 | Discharge: 2023-08-31 | DRG: 304 | Disposition: A | Discharge status: Home / Self Care | Attending: Internal Medicine | Admitting: Internal Medicine

## 2023-08-27 ENCOUNTER — Other Ambulatory Visit: Payer: Self-pay

## 2023-08-27 DIAGNOSIS — E8770 Fluid overload, unspecified: Secondary | ICD-10-CM | POA: Diagnosis present

## 2023-08-27 DIAGNOSIS — R600 Localized edema: Secondary | ICD-10-CM | POA: Diagnosis present

## 2023-08-27 DIAGNOSIS — R519 Headache, unspecified: Secondary | ICD-10-CM | POA: Diagnosis present

## 2023-08-27 DIAGNOSIS — I10 Essential (primary) hypertension: Secondary | ICD-10-CM | POA: Diagnosis present

## 2023-08-27 DIAGNOSIS — Z6830 Body mass index (BMI) 30.0-30.9, adult: Secondary | ICD-10-CM

## 2023-08-27 DIAGNOSIS — I160 Hypertensive urgency: Secondary | ICD-10-CM | POA: Diagnosis not present

## 2023-08-27 DIAGNOSIS — E119 Type 2 diabetes mellitus without complications: Secondary | ICD-10-CM | POA: Diagnosis present

## 2023-08-27 DIAGNOSIS — I169 Hypertensive crisis, unspecified: Secondary | ICD-10-CM | POA: Diagnosis not present

## 2023-08-27 DIAGNOSIS — Z7984 Long term (current) use of oral hypoglycemic drugs: Secondary | ICD-10-CM

## 2023-08-27 DIAGNOSIS — Z79899 Other long term (current) drug therapy: Secondary | ICD-10-CM

## 2023-08-27 DIAGNOSIS — E669 Obesity, unspecified: Secondary | ICD-10-CM | POA: Diagnosis present

## 2023-08-27 DIAGNOSIS — Z8673 Personal history of transient ischemic attack (TIA), and cerebral infarction without residual deficits: Secondary | ICD-10-CM

## 2023-08-27 DIAGNOSIS — R7989 Other specified abnormal findings of blood chemistry: Secondary | ICD-10-CM | POA: Diagnosis present

## 2023-08-27 DIAGNOSIS — R946 Abnormal results of thyroid function studies: Secondary | ICD-10-CM | POA: Diagnosis present

## 2023-08-27 DIAGNOSIS — E785 Hyperlipidemia, unspecified: Secondary | ICD-10-CM | POA: Diagnosis present

## 2023-08-27 DIAGNOSIS — J189 Pneumonia, unspecified organism: Secondary | ICD-10-CM | POA: Diagnosis present

## 2023-08-27 DIAGNOSIS — N179 Acute kidney failure, unspecified: Secondary | ICD-10-CM | POA: Diagnosis present

## 2023-08-27 DIAGNOSIS — Z1152 Encounter for screening for COVID-19: Secondary | ICD-10-CM

## 2023-08-27 LAB — BASIC METABOLIC PANEL WITH GFR
Anion gap: 9 (ref 5–15)
BUN: 52 mg/dL — ABNORMAL HIGH (ref 6–20)
CO2: 25 mmol/L (ref 22–32)
Calcium: 9 mg/dL (ref 8.9–10.3)
Chloride: 103 mmol/L (ref 98–111)
Creatinine, Ser: 1.78 mg/dL — ABNORMAL HIGH (ref 0.61–1.24)
GFR, Estimated: 50 mL/min — ABNORMAL LOW (ref >60)
Glucose, Bld: 127 mg/dL — ABNORMAL HIGH (ref 70–99)
Potassium: 4.5 mmol/L (ref 3.5–5.1)
Sodium: 137 mmol/L (ref 135–145)

## 2023-08-27 LAB — CBC
HCT: 36.3 % — ABNORMAL LOW (ref 39.0–52.0)
Hemoglobin: 11.8 g/dL — ABNORMAL LOW (ref 13.0–17.0)
MCH: 28.2 pg (ref 26.0–34.0)
MCHC: 32.5 g/dL (ref 30.0–36.0)
MCV: 86.6 fL (ref 80.0–100.0)
Platelets: 352 10*3/uL (ref 150–400)
RBC: 4.19 MIL/uL — ABNORMAL LOW (ref 4.22–5.81)
RDW: 14.8 % (ref 11.5–15.5)
WBC: 8.4 10*3/uL (ref 4.0–10.5)
nRBC: 0 % (ref 0.0–0.2)

## 2023-08-27 LAB — URINALYSIS, ROUTINE W REFLEX MICROSCOPIC
Bilirubin Urine: NEGATIVE
Glucose, UA: 500 mg/dL — AB
Ketones, ur: NEGATIVE mg/dL
Leukocytes,Ua: NEGATIVE
Nitrite: NEGATIVE
Protein, ur: >300 mg/dL — AB
Specific Gravity, Urine: 1.013 (ref 1.005–1.030)
pH: 6.5 (ref 5.0–8.0)

## 2023-08-27 MED ORDER — CLONIDINE HCL 0.1 MG PO TABS
0.2000 mg | ORAL_TABLET | Freq: Once | ORAL | Status: AC
Start: 2023-08-27 — End: 2023-08-27
  Administered 2023-08-27: 0.2 mg via ORAL
  Filled 2023-08-27: qty 2

## 2023-08-27 NOTE — ED Provider Notes (Addendum)
 [place] EMERGENCY DEPARTMENT AT [facility] Provider Note   CSN: [id number] Arrival date & time: 08/27/23  1935     History Chief Complaint  Patient presents with   Hypertension    HPI YERIEL MINEO is a 38 y.o. male presenting for chief complaint of high blood pressure. States that he saw his cardiologist last week and had clonidine  added on to his medication list and his pressures had been well controled up until today. Had been doing well but today had a myriad of headache and fatigue and found his BP to be 160s today  Also endorsing edema in his BL LE.  Patient's recorded medical, surgical, social, medication list and allergies were reviewed in the Snapshot window as part of the initial history.   Review of Systems   Review of Systems  Constitutional:  Positive for fatigue. Negative for chills and fever.  HENT:  Negative for ear pain and sore throat.   Eyes:  Negative for pain and visual disturbance.  Respiratory:  Negative for cough and shortness of breath.   Cardiovascular:  Positive for leg swelling. Negative for chest pain and palpitations.  Gastrointestinal:  Negative for abdominal pain and vomiting.  Genitourinary:  Negative for dysuria and hematuria.  Musculoskeletal:  Negative for arthralgias and back pain.  Skin:  Negative for color change and rash.  Neurological:  Positive for headaches. Negative for seizures and syncope.  All other systems reviewed and are negative.   Physical Exam Updated Vital Signs BP (!) 161/107   Pulse 86   Temp 98.7 F (37.1 C) (Oral)   Resp 12   Ht 6\' 2"  (1.88 m)   Wt 106.6 kg   SpO2 100%   BMI 30.17 kg/m  Physical Exam Vitals and nursing note reviewed.  Constitutional:      General: He is not in acute distress.    Appearance: He is well-developed.  HENT:     Head: Normocephalic and atraumatic.  Eyes:     Conjunctiva/sclera: Conjunctivae normal.  Cardiovascular:     Rate and Rhythm: Normal rate and  regular rhythm.     Heart sounds: No murmur heard. Pulmonary:     Effort: Pulmonary effort is normal. No respiratory distress.     Breath sounds: Normal breath sounds.  Abdominal:     Palpations: Abdomen is soft.     Tenderness: There is no abdominal tenderness.  Musculoskeletal:        General: No swelling or deformity.     Cervical back: Neck supple.     Right lower leg: Edema present.     Left lower leg: Edema present.  Skin:    General: Skin is warm and dry.     Capillary Refill: Capillary refill takes less than 2 seconds.  Neurological:     Mental Status: He is alert.  Psychiatric:        Mood and Affect: Mood normal.      ED Course/ Medical Decision Making/ A&P    Procedures .Critical Care  Performed by: Onetha Bile, MD Authorized by: Onetha Bile, MD   Critical care provider statement:    Critical care time (minutes):  95   Critical care was necessary to treat or prevent imminent or life-threatening deterioration of the following conditions:  Circulatory failure   Critical care was time spent personally by me on the following activities:  Development of treatment plan with patient or surrogate, discussions with consultants, evaluation of patient's response to treatment, examination of patient,  ordering and review of laboratory studies, ordering and review of radiographic studies, ordering and performing treatments and interventions, pulse oximetry, re-evaluation of patient's condition and review of old charts Comments:     Hypertensive crisis requiring serial treatments.    Medications Ordered in ED Medications  cloNIDine  (CATAPRES ) tablet 0.2 mg (has no administration in time range)   Medical Decision Making:   Dondi D Vargo is a 38 y.o. male who presented to the ED today with elevated BP detailed above.    Additional history discussed with patient's family/caregivers.  Patient placed on continuous vitals and telemetry monitoring while in ED  which was reviewed periodically.  Complete initial physical exam performed, notably the patient  was grossly hypertensive but otherwise well-appearing.    Reviewed and confirmed nursing documentation for past medical history, family history, social history.    Initial Assessment:   With the patient's presentation of elevated blood pressure readings, most likely diagnosis is hypertensive urgency. Other diagnoses associated with hypertensive emergency were considered including (but not limited to) intracranial hemorrhage, acute renal artery stenosis, acute kidney injury, myocardial stress, ophthalmologic emergencies. These are considered less likely due to history of present illness and physical exam findings.   This is most consistent with an acute life/limb threatening illness complicated by underlying chronic conditions. Will evaluate for hypertensive emergency as below.  Initial Plan:  Screening labs including CBC and Metabolic panel to evaluate for infectious or metabolic etiology of disease.  Urinalysis with reflex culture ordered to evaluate for UTI or relevant urologic/nephrologic pathology.  CXR to evaluate for structural/infectious intrathoracic pathology.  Given headache, eval for ICH with CTH EKG/Troponin testing/BNP testing to evaluate for cardiac pathology. Objective evaluation as below reviewed. Considered further administration of antihypertensives in ED, per consensus guidelines for [facility] of emergency physicians, acute treatment of hypertensive urgency alone in the emergency department is not recommended.  If patient has evidence of hypertensive emergency on objective laboratory evaluation, will reevaluate.  Will monitor blood pressure while patient awaiting above laboratory studies.  Initial Study Results:   Laboratory  AKI GFR ->50  EKG EKG was reviewed independently. Rate, rhythm, axis, intervals all examined and without medically relevant abnormality. ST segments  without concerns for elevations.    Radiology:  All images reviewed independently. Agree with radiology report at this time.   No results found.  Reassessment and Plan:   Hypertensive crisis. Either 2/2 AKI or with a resulting AKI. Likely needs to transition off of the nephrotoxic antihypertensives.    Disposition:   Based on the above findings, I believe this patient is stable for admission.    Patient/family educated about specific findings on our evaluation and explained exact reasons for admission.  Patient/family educated about clinical situation and time was allowed to answer questions.   Admission team communicated with and agreed with need for admission. Patient admitted. Patient ready to move at this time.     Emergency Department Medication Summary:   Medications  cloNIDine  (CATAPRES ) tablet 0.2 mg (has no administration in time range)    Clinical Impression:  1. Hypertensive crisis   2. AKI (acute kidney injury) (HCC)      Data Unavailable   Final Clinical Impression(s) / ED Diagnoses Final diagnoses:  Hypertensive crisis  AKI (acute kidney injury) [facility])    Rx / DC Orders ED Discharge Orders     None         Onetha Bile, MD 08/28/23 1610    Onetha Bile, MD 08/28/23 [phone]

## 2023-08-27 NOTE — ED Triage Notes (Addendum)
 Pt presents with HA and elevated BP today. He takes losartan /hydrochlorothiazide 100-25 and 1 week ago was placed on clonidine  0.2 mg. Since being placed on clonidine  his BP has been well controlled. Today pt went finishing and developed a HA and noted his BP was 180/130. He is also having some generalized body aches and mild peripheral edema. He denies CP, ShOB, blurred vision.

## 2023-08-28 ENCOUNTER — Encounter (HOSPITAL_COMMUNITY): Payer: Self-pay | Admitting: Internal Medicine

## 2023-08-28 ENCOUNTER — Inpatient Hospital Stay (HOSPITAL_COMMUNITY)

## 2023-08-28 ENCOUNTER — Emergency Department (HOSPITAL_BASED_OUTPATIENT_CLINIC_OR_DEPARTMENT_OTHER)

## 2023-08-28 DIAGNOSIS — Z1152 Encounter for screening for COVID-19: Secondary | ICD-10-CM | POA: Diagnosis not present

## 2023-08-28 DIAGNOSIS — J189 Pneumonia, unspecified organism: Secondary | ICD-10-CM | POA: Diagnosis present

## 2023-08-28 DIAGNOSIS — R600 Localized edema: Secondary | ICD-10-CM | POA: Diagnosis present

## 2023-08-28 DIAGNOSIS — R519 Headache, unspecified: Secondary | ICD-10-CM | POA: Diagnosis present

## 2023-08-28 DIAGNOSIS — I169 Hypertensive crisis, unspecified: Secondary | ICD-10-CM | POA: Diagnosis present

## 2023-08-28 DIAGNOSIS — I160 Hypertensive urgency: Secondary | ICD-10-CM | POA: Diagnosis present

## 2023-08-28 DIAGNOSIS — R7989 Other specified abnormal findings of blood chemistry: Secondary | ICD-10-CM | POA: Diagnosis present

## 2023-08-28 DIAGNOSIS — I10 Essential (primary) hypertension: Secondary | ICD-10-CM | POA: Diagnosis present

## 2023-08-28 DIAGNOSIS — Z6830 Body mass index (BMI) 30.0-30.9, adult: Secondary | ICD-10-CM | POA: Diagnosis not present

## 2023-08-28 DIAGNOSIS — E669 Obesity, unspecified: Secondary | ICD-10-CM | POA: Diagnosis present

## 2023-08-28 DIAGNOSIS — E119 Type 2 diabetes mellitus without complications: Secondary | ICD-10-CM | POA: Diagnosis present

## 2023-08-28 DIAGNOSIS — Z79899 Other long term (current) drug therapy: Secondary | ICD-10-CM | POA: Diagnosis not present

## 2023-08-28 DIAGNOSIS — N179 Acute kidney failure, unspecified: Secondary | ICD-10-CM | POA: Diagnosis present

## 2023-08-28 DIAGNOSIS — R946 Abnormal results of thyroid function studies: Secondary | ICD-10-CM | POA: Diagnosis present

## 2023-08-28 DIAGNOSIS — E785 Hyperlipidemia, unspecified: Secondary | ICD-10-CM | POA: Diagnosis present

## 2023-08-28 DIAGNOSIS — E8770 Fluid overload, unspecified: Secondary | ICD-10-CM | POA: Diagnosis present

## 2023-08-28 DIAGNOSIS — Z7984 Long term (current) use of oral hypoglycemic drugs: Secondary | ICD-10-CM | POA: Diagnosis not present

## 2023-08-28 DIAGNOSIS — Z8673 Personal history of transient ischemic attack (TIA), and cerebral infarction without residual deficits: Secondary | ICD-10-CM | POA: Diagnosis not present

## 2023-08-28 LAB — ECHOCARDIOGRAM COMPLETE
AR max vel: 2.84 cm2
AV Peak grad: 16.3 mmHg
Ao pk vel: 2.02 m/s
Area-P 1/2: 5.93 cm2
Height: 74 in
MV VTI: 3.12 cm2
S' Lateral: 3.5 cm
Weight: 3760 [oz_av]

## 2023-08-28 LAB — CREATININE, URINE, RANDOM: Creatinine, Urine: 61 mg/dL

## 2023-08-28 LAB — GLUCOSE, CAPILLARY
Glucose-Capillary: 157 mg/dL — ABNORMAL HIGH (ref 70–99)
Glucose-Capillary: 189 mg/dL — ABNORMAL HIGH (ref 70–99)
Glucose-Capillary: 145 mg/dL — ABNORMAL HIGH (ref 70–99)

## 2023-08-28 LAB — RESP PANEL BY RT-PCR (RSV, FLU A&B, COVID)  RVPGX2
Influenza A by PCR: NEGATIVE
Influenza B by PCR: NEGATIVE
Resp Syncytial Virus by PCR: NEGATIVE
SARS Coronavirus 2 by RT PCR: NEGATIVE

## 2023-08-28 LAB — HEMOGLOBIN A1C
Hgb A1c MFr Bld: 6.4 % — ABNORMAL HIGH (ref 4.8–5.6)
Mean Plasma Glucose: 136.98 mg/dL

## 2023-08-28 LAB — NA AND K (SODIUM & POTASSIUM), RAND UR
Potassium Urine: 37 mmol/L
Sodium, Ur: 57 mmol/L

## 2023-08-28 LAB — PRO BRAIN NATRIURETIC PEPTIDE: Pro Brain Natriuretic Peptide: 1396 pg/mL — ABNORMAL HIGH (ref <300.0)

## 2023-08-28 MED ORDER — AZITHROMYCIN 500 MG PO TABS
500.0000 mg | ORAL_TABLET | Freq: Every day | ORAL | Status: AC
  Administered 2023-08-28: 500 mg via ORAL
  Filled 2023-08-28: qty 1

## 2023-08-28 MED ORDER — LABETALOL HCL 5 MG/ML IV SOLN
10.0000 mg | Freq: Once | INTRAVENOUS | Status: AC
  Administered 2023-08-28: 10 mg via INTRAVENOUS
  Filled 2023-08-28: qty 4

## 2023-08-28 MED ORDER — ALBUTEROL SULFATE (2.5 MG/3ML) 0.083% IN NEBU: 2.5000 mg | INHALATION_SOLUTION | RESPIRATORY_TRACT | Status: DC | PRN

## 2023-08-28 MED ORDER — LABETALOL HCL 200 MG PO TABS
200.0000 mg | ORAL_TABLET | Freq: Two times a day (BID) | ORAL | Status: DC
  Administered 2023-08-28 – 2023-08-31 (×7): 200 mg via ORAL
  Filled 2023-08-28 (×7): qty 1

## 2023-08-28 MED ORDER — INSULIN ASPART 100 UNIT/ML IJ SOLN: 0.0000 [IU] | Freq: Every day | INTRAMUSCULAR | Status: DC

## 2023-08-28 MED ORDER — ONDANSETRON HCL 4 MG PO TABS: 4.0000 mg | ORAL_TABLET | Freq: Four times a day (QID) | ORAL | Status: DC | PRN

## 2023-08-28 MED ORDER — ACETAMINOPHEN 325 MG PO TABS
650.0000 mg | ORAL_TABLET | Freq: Four times a day (QID) | ORAL | Status: DC | PRN
Start: 2023-08-28 — End: 2023-08-31
  Administered 2023-08-28 – 2023-08-31 (×9): 650 mg via ORAL
  Filled 2023-08-28 (×9): qty 2

## 2023-08-28 MED ORDER — CLONIDINE HCL 0.2 MG PO TABS
0.2000 mg | ORAL_TABLET | Freq: Two times a day (BID) | ORAL | Status: DC
  Administered 2023-08-28 (×2): 0.2 mg via ORAL
  Filled 2023-08-28 (×2): qty 1

## 2023-08-28 MED ORDER — ONDANSETRON HCL 4 MG/2ML IJ SOLN: 4.0000 mg | Freq: Four times a day (QID) | INTRAMUSCULAR | Status: DC | PRN

## 2023-08-28 MED ORDER — AZITHROMYCIN 250 MG PO TABS
250.0000 mg | ORAL_TABLET | Freq: Every day | ORAL | Status: DC
  Administered 2023-08-29 – 2023-08-31 (×3): 250 mg via ORAL
  Filled 2023-08-28 (×3): qty 1

## 2023-08-28 MED ORDER — PROCHLORPERAZINE EDISYLATE 10 MG/2ML IJ SOLN
10.0000 mg | Freq: Once | INTRAMUSCULAR | Status: AC
  Administered 2023-08-28: 10 mg via INTRAVENOUS
  Filled 2023-08-28: qty 2

## 2023-08-28 MED ORDER — ASPIRIN 81 MG PO TBEC
81.0000 mg | DELAYED_RELEASE_TABLET | Freq: Every day | ORAL | Status: DC
  Administered 2023-08-28 – 2023-08-31 (×4): 81 mg via ORAL
  Filled 2023-08-28 (×4): qty 1

## 2023-08-28 MED ORDER — HEPARIN SODIUM (PORCINE) 5000 UNIT/ML IJ SOLN
5000.0000 [IU] | Freq: Three times a day (TID) | INTRAMUSCULAR | Status: DC
  Filled 2023-08-28 (×2): qty 1

## 2023-08-28 MED ORDER — INSULIN ASPART 100 UNIT/ML IJ SOLN
0.0000 [IU] | Freq: Three times a day (TID) | INTRAMUSCULAR | Status: DC
  Administered 2023-08-28 (×2): 3 [IU] via SUBCUTANEOUS
  Administered 2023-08-29 – 2023-08-30 (×3): 2 [IU] via SUBCUTANEOUS

## 2023-08-28 MED ORDER — HYDRALAZINE HCL 20 MG/ML IJ SOLN
10.0000 mg | INTRAMUSCULAR | Status: DC | PRN
  Administered 2023-08-28 – 2023-08-29 (×2): 10 mg via INTRAVENOUS
  Filled 2023-08-28 (×3): qty 1

## 2023-08-28 MED ORDER — ACETAMINOPHEN 500 MG PO TABS
1000.0000 mg | ORAL_TABLET | Freq: Once | ORAL | Status: AC
  Administered 2023-08-28: 1000 mg via ORAL
  Filled 2023-08-28: qty 2

## 2023-08-28 MED ORDER — TRAZODONE HCL 50 MG PO TABS: 25.0000 mg | ORAL_TABLET | Freq: Every evening | ORAL | Status: DC | PRN

## 2023-08-28 MED ORDER — GLIMEPIRIDE 4 MG PO TABS
5.0000 mg | ORAL_TABLET | Freq: Every day | ORAL | Status: DC
  Administered 2023-08-29 – 2023-08-31 (×3): 5 mg via ORAL
  Filled 2023-08-28 (×4): qty 1

## 2023-08-28 MED ORDER — LABETALOL HCL 200 MG PO TABS
200.0000 mg | ORAL_TABLET | Freq: Three times a day (TID) | ORAL | Status: DC
Start: 2023-08-28 — End: 2023-08-28

## 2023-08-28 MED ORDER — HYDRALAZINE HCL 20 MG/ML IJ SOLN
5.0000 mg | INTRAMUSCULAR | Status: DC | PRN
  Administered 2023-08-28: 5 mg via INTRAVENOUS
  Filled 2023-08-28: qty 1

## 2023-08-28 MED ORDER — DIPHENHYDRAMINE HCL 50 MG/ML IJ SOLN
50.0000 mg | Freq: Once | INTRAMUSCULAR | Status: AC
  Administered 2023-08-28: 50 mg via INTRAVENOUS
  Filled 2023-08-28: qty 1

## 2023-08-28 NOTE — ED Notes (Signed)
 [facility] called for transport

## 2023-08-28 NOTE — ED Notes (Signed)
-  Patient's father would like a phone call when patient has a room. Info in listed in chart and verified correct.

## 2023-08-28 NOTE — ED Notes (Signed)
 Care handoff given to [facility]

## 2023-08-28 NOTE — Hospital Course (Addendum)
 Brief Narrative:  38 year old with history of DM2, uncontrolled HTN, obesity, HLD comes to the hospital for hypertensive urgency.  Recently seen by Dr. Berry Bristol from cardiology on 5/16 and he was prescribed clonidine , labetalol  and Hyzaar.  Upon admission his systolic blood pressure was in 180s.   Assessment & Plan:  Principal Problem:   Hypertensive urgency     Hypertensive urgency History of uncontrolled hypertension - Patient's home medication including clonidine , labetalol  has been resumed.  Hyzaar on hold due to mild AKI.  2D echo showed moderate LVH, EF 65%.  TSH elevated, free T4 pending.  UDS positive for THC - Continue clonidine  0.3 mg twice daily, labetalol  200 mg twice daily.  Add Norvasc  10 mg   Elevated BNP with concerns of diastolic CHF - Echo shows moderate LVH with preserved EF.  Due to AKI holding off on diuretics   Pneumonia -Chest x-ray showing right upper lobe opacity.  Currently on azithromycin, will add 5 days of Augmentin.  Bronchodilators as needed.   Acute kidney injury -Baseline creatinine 1.2, admission creatinine 1.78.  Renal ultrasound negative.  Creatinine improving 1.59 today   Type 2 diabetes- -A1c 6.4.  On Amaryl, sliding scale and Accu-Cheks.    DVT prophylaxis: heparin injection 5,000 Units Start: 08/28/23 1400    Code Status: Full Code Family Communication:   Status is: Inpatient Continue hospital stay for at least next 24 hours to ensure blood pressure trends better    Subjective:  Still significantly elevated blood pressure.  Feeling slightly better today.  Examination:  General exam: Appears calm and comfortable  Respiratory system: Clear to auscultation. Respiratory effort normal. Cardiovascular system: S1 & S2 heard, RRR. No JVD, murmurs, rubs, gallops or clicks. No pedal edema. Gastrointestinal system: Abdomen is nondistended, soft and nontender. No organomegaly or masses felt. Normal bowel sounds heard. Central nervous system:  Alert and oriented. No focal neurological deficits. Extremities: Symmetric 5 x 5 power. Skin: No rashes, lesions or ulcers Psychiatry: Judgement and insight appear normal. Mood & affect appropriate.

## 2023-08-28 NOTE — TOC Initial Note (Signed)
 Transition of Care [facility]) - Initial/Assessment Note    Patient Details  Name: Francisco Carey MRN: [id number] Date of Birth: 1985/06/25  Transition of [facility]) CM/SW Contact:    Ruben Corolla, RN Phone Number: 08/28/2023, 12:20 PM  Clinical Narrative:  d/c plan home.                 Expected Discharge Plan: Home/Self Care Barriers to Discharge: Continued Medical Work up   Patient Goals and CMS Choice Patient states their goals for this hospitalization and ongoing recovery are:: Home CMS Medicare.gov Compare Post Acute Care list provided to:: Patient Choice offered to / list presented to : Patient [place] ownership interest in [facility].provided to:: Patient    Expected Discharge Plan and Services                                              Prior Living Arrangements/Services                       Activities of Daily Living   ADL Screening (condition at time of admission) Independently performs ADLs?: Yes (appropriate for developmental age) Is the patient deaf or have difficulty hearing?: No Does the patient have difficulty seeing, even when wearing glasses/contacts?: No Does the patient have difficulty concentrating, remembering, or making decisions?: No  Permission Sought/Granted                  Emotional Assessment              Admission diagnosis:  Hypertensive crisis [I16.9] Hypertensive urgency [I16.0] AKI (acute kidney injury) (HCC) [N17.9] Patient Active Problem List   Diagnosis Date Noted   Hypertensive urgency 08/28/2023   DM (diabetes mellitus) (HCC) 02/16/2012   HTN (hypertension) 02/16/2012   Dyslipidemia 02/16/2012   TIA (transient ischemic attack) 02/16/2012   PCP:  Dianah Fort, PA Pharmacy:   [facility] [id number] - [place], [place] - [address], [address] [place] [address] Phone: [phone] Fax:  [phone]  [facility] [id number] [place], [place] - [address] [place] [address] Phone: [phone] Fax: [phone]  [facility] [id number] [place], [place] - [address] [place] [address] Phone: [phone] Fax: [phone]  [facility] [address] [place] [address] Phone: [phone] Fax: [phone]  [id number] [place], [place] - [address] [place] [address] Phone: [phone] Fax: [phone]     Social Drivers of Health (SDOH) Social History: SDOH Screenings   Food Insecurity: No Food Insecurity (08/28/2023)  Housing: Low Risk  (08/28/2023)  Transportation Needs: No Transportation Needs (08/28/2023)  Utilities: Not At Risk (08/28/2023)  Financial Resource Strain: Low Risk  (07/22/2023)   Received from [facility]  Social Connections: Unknown (08/28/2023)  Tobacco Use: Low Risk  (08/28/2023)   SDOH Interventions:     Readmission Risk Interventions     No data to display

## 2023-08-28 NOTE — H&P (Signed)
 History and Physical  Francisco Carey JYN:[id number] DOB: 09-Jun-1985 DOA: 08/27/2023  PCP: Dianah Fort, PA   Chief Complaint: High blood pressure  HPI: Francisco Carey is a 38 y.o. male with medical history significant for diabetes, difficult to control hypertension, obesity, hyperlipidemia admitted to the hospital with hypertensive urgency.  He was actually seen by Dr. Berry Bristol on an outpatient basis on 5/16 due to his poorly controlled hypertension, and he was prescribed clonidine  and labetalol  in addition to his Hyzaar.  Patient states that he started taking clonidine  and noticed that his blood pressure was in the normal range approximately 120/80.  As such, he never started taking the labetalol .  He checks his blood pressure twice daily and they have remained well-controlled.  However in the last 24 hours, he started having some bodyaches, headache, just feeling not himself.  He denies any shortness of breath, orthopnea, or chest pain.  No cough.  No sick contacts.  When he checked his blood pressure last evening, it was 180/130.  He denies any headache, blurred vision, or confusion.  Notably, he had a fever of 102.5 this morning.  Review of Systems: Please see HPI for pertinent positives and negatives. A complete 10 system review of systems are otherwise negative.  Past Medical History:  Diagnosis Date   Diabetes mellitus without complication (HCC)    ED (erectile dysfunction)    Edema of both lower extremities    HLD (hyperlipidemia)    Hypertension    Overweight    Past Surgical History:  Procedure Laterality Date   KNEE SURGERY     KNEE SURGERY     Social History:  reports that he has never smoked. He has never used smokeless tobacco. He reports that he does not currently use alcohol. He reports that he does not use drugs.  No Known Allergies  Family History  Problem Relation Age of Onset   Healthy Father    Healthy Sister      Prior to Admission medications    Medication Sig Start Date End Date Taking? Authorizing Provider  aspirin  EC 81 MG tablet Take 81 mg by mouth daily. Patient not taking: Reported on 07/10/2023    [provider]  cloNIDine  (CATAPRES ) 0.2 MG tablet Take 1 tablet (0.2 mg total) by mouth 2 (two) times daily. 08/22/23   Knox Perl, MD  ezetimibe (ZETIA) 10 MG tablet Take 10 mg by mouth daily. Patient not taking: Reported on 08/22/2023    [provider]  FARXIGA 5 MG TABS tablet Take 5 mg by mouth daily.    [provider]  glimepiride (AMARYL) 1 MG tablet Take 5 mg by mouth daily with breakfast.    [provider]  glipiZIDE  (GLUCOTROL ) 5 MG tablet Take 1 tablet (5 mg total) by mouth daily before breakfast. Patient not taking: Reported on 08/22/2023 03/26/23   Adel Aden, PA-C  insulin  detemir (LEVEMIR ) 100 UNIT/ML injection Inject 0.1 mLs (10 Units total) into the skin at bedtime. Patient not taking: Reported on 01/19/2020 11/21/17   Soto, Johana, PA-C  labetalol  (NORMODYNE ) 200 MG tablet Take 1 tablet (200 mg total) by mouth 2 (two) times daily. 08/22/23   Knox Perl, MD  losartan -hydrochlorothiazide (HYZAAR) 100-25 MG tablet Take 1 tablet by mouth daily.    [provider]  metFORMIN  (GLUCOPHAGE ) 1000 MG tablet Take 1 tablet (1,000 mg total) by mouth 2 (two) times daily. Patient not taking: Reported on 08/22/2023 11/01/20   Delray Fielding, PA-C  ondansetron  (ZOFRAN ) 4 MG tablet Take 1 tablet (4 mg total) by mouth every 6 (six) hours as needed for nausea or vomiting. Patient not taking: Reported on 08/22/2023 05/21/22   Oakdale Butter, PA  promethazine -dextromethorphan (PROMETHAZINE -DM) 6.25-15 MG/5ML syrup Take 2.5 mLs by mouth 4 (four) times daily as needed for cough. Patient not taking: Reported on 08/22/2023 06/25/23   Sherida Dimmer, PA-C  sildenafil  (VIAGRA ) 50 MG tablet Take 1 tablet (50 mg total) by mouth daily as needed for erectile dysfunction. 03/26/23   Adel Aden, PA-C    Physical Exam: BP (!) 166/93   Pulse (!) 102   Temp (!) 102.5 F (39.2 C) (Oral)   Resp 16   Ht 6\' 2"  (1.88 m)   Wt 106.6 kg   SpO2 100%   BMI 30.17 kg/m  General:  Alert, oriented, calm, in no acute distress  Eyes: EOMI, clear conjuctivae, white sclerea Neck: supple, no masses, trachea mildline  Cardiovascular: RRR, no murmurs or rubs, no peripheral edema  Respiratory: clear to auscultation bilaterally, no wheezes, no crackles  Abdomen: soft, nontender, nondistended, normal bowel tones heard  Skin: dry, no rashes  Musculoskeletal: no joint effusions, normal range of motion  Psychiatric: appropriate affect, normal speech  Neurologic: extraocular muscles intact, clear speech, moving all extremities with intact sensorium         Labs on Admission:  Basic Metabolic Panel: Recent Labs  Lab 08/27/23 1958  NA 137  K 4.5  CL 103  CO2 25  GLUCOSE 127*  BUN 52*  CREATININE 1.78*  CALCIUM 9.0   Liver Function Tests: No results for input(s): "AST", "ALT", "ALKPHOS", "BILITOT", "PROT", "ALBUMIN" in the last 168 hours. No results for input(s): "LIPASE", "AMYLASE" in the last 168 hours. No results for input(s): "AMMONIA" in the last 168 hours. CBC: Recent Labs  Lab 08/27/23 1958  WBC 8.4  HGB 11.8*  HCT 36.3*  MCV 86.6  PLT 352   Cardiac Enzymes: No results for input(s): "CKTOTAL", "CKMB", "CKMBINDEX", "TROPONINI" in the last 168 hours. BNP (last 3 results) No results for input(s): "BNP" in the last 8760 hours.  ProBNP (last 3 results) Recent Labs    08/27/23 2324  PROBNP 1,396.0*    CBG: No results for input(s): "GLUCAP" in the last 168 hours.  Radiological Exams on Admission: DG Chest Portable 1 View Result Date: 08/28/2023 CLINICAL DATA:  Hypertension EXAM: PORTABLE CHEST 1 VIEW COMPARISON:  09/20/2022 FINDINGS: Cardiac shadow is stable. Lungs are well aerated bilaterally. Patchy airspace opacity is noted in the right upper lobe which  may represent early infiltrate. No bony abnormality is noted. IMPRESSION: Patchy right upper lobe opacity. Electronically Signed   By: Violeta Grey M.D.   On: 08/28/2023 00:53   US  Renal Result Date: 08/28/2023 CLINICAL DATA:  Acute renal injury EXAM: RENAL / URINARY TRACT ULTRASOUND COMPLETE COMPARISON:  None Available. FINDINGS: Right Kidney: Renal measurements: 12.8 x 5.9 x 6.6 cm. = volume: 260 mL. Echogenicity within normal limits. No mass or hydronephrosis visualized. Left Kidney: Renal measurements: 12.6 x 6.7 x 6.0 cm. = volume: 267 mL. Echogenicity within normal limits. No mass or hydronephrosis visualized. Bladder: Appears normal for degree of bladder distention. Other: None. IMPRESSION: Normal appearing kidneys bilaterally. Electronically Signed   By: Violeta Grey M.D.   On: 08/28/2023 00:20   Assessment/Plan Francisco Carey is a 38 y.o. male with medical history significant for diabetes, difficult to control hypertension, obesity, hyperlipidemia admitted to the hospital with  hypertensive urgency.  Hypertensive urgency-patient appears to be well versed and compliant with his home medications, it seems that his blood pressure has been well-controlled until he developed what sounds like a viral syndrome.  Has not been taking any additional over-the-counter medications.  He does not look significantly fluid overloaded on exam. -Inpatient admission with telemetry -Hold Hyzaar due to AKI, resume clonidine  and labetalol  since holding Hyzaar -IV hydralazine for uncontrolled blood pressure -Check 2D echo (was previously scheduled as an outpatient 6/25)  Elevated BNP-patient looks slightly fluid overloaded on exam, though not in overt heart failure -If blood pressure remains difficult to control, could consider an empiric dose of IV Lasix -Follow-up 2D echo  Pneumonia-suspected due to fever, body aches, and possible consolidation on chest x-ray.  Could be viral syndrome as well. -Blood  culture due to fever -P.o. azithromycin -Check viral respiratory panel  Acute kidney injury-he has normal GFR at baseline.  Unclear etiology, though could be related to uncontrolled hypertension.  Renal ultrasound unremarkable. -Hold Hyzaar, Farxiga and other nephrotoxins  Type 2 diabetes-unclear to me how well-controlled this is, though blood sugar is not significantly elevated here -Carb modified diet -Check hemoglobin A1c -Continue home Amaryl, with moderate dose sliding scale -Holding Farxiga as above, due to AKI  DVT prophylaxis: Subcutaneous heparin    Code Status: Full Code  Consults called: None  Admission status: The appropriate patient status for this patient is INPATIENT. Inpatient status is judged to be reasonable and necessary in order to provide the required intensity of service to ensure the patient's safety. The patient's presenting symptoms, physical exam findings, and initial radiographic and laboratory data in the context of their chronic comorbidities is felt to place them at high risk for further clinical deterioration. Furthermore, it is not anticipated that the patient will be medically stable for discharge from the hospital within 2 midnights of admission.    I certify that at the point of admission it is my clinical judgment that the patient will require inpatient hospital care spanning beyond 2 midnights from the point of admission due to high intensity of service, high risk for further deterioration and high frequency of surveillance required  Time spent: 56 minutes  Lilliah Priego Rickey Charm MD [facility] Pager [phone]  If 7PM-7AM, please contact night-coverage [email or web address] Password [email or web address]  08/28/2023, 9:29 AM

## 2023-08-28 NOTE — ED Notes (Addendum)
 Pt father updated regarding pt bed status, per request.

## 2023-08-29 DIAGNOSIS — I160 Hypertensive urgency: Secondary | ICD-10-CM | POA: Diagnosis not present

## 2023-08-29 LAB — BASIC METABOLIC PANEL WITH GFR
Anion gap: 8 (ref 5–15)
BUN: 41 mg/dL — ABNORMAL HIGH (ref 6–20)
CO2: 25 mmol/L (ref 22–32)
Calcium: 8.2 mg/dL — ABNORMAL LOW (ref 8.9–10.3)
Chloride: 102 mmol/L (ref 98–111)
Creatinine, Ser: 1.93 mg/dL — ABNORMAL HIGH (ref 0.61–1.24)
GFR, Estimated: 46 mL/min — ABNORMAL LOW (ref >60)
Glucose, Bld: 152 mg/dL — ABNORMAL HIGH (ref 70–99)
Potassium: 4 mmol/L (ref 3.5–5.1)
Sodium: 135 mmol/L (ref 135–145)

## 2023-08-29 LAB — CBC
HCT: 35.8 % — ABNORMAL LOW (ref 39.0–52.0)
Hemoglobin: 11.5 g/dL — ABNORMAL LOW (ref 13.0–17.0)
MCH: 27.8 pg (ref 26.0–34.0)
MCHC: 32.1 g/dL (ref 30.0–36.0)
MCV: 86.7 fL (ref 80.0–100.0)
Platelets: 310 10*3/uL (ref 150–400)
RBC: 4.13 MIL/uL — ABNORMAL LOW (ref 4.22–5.81)
RDW: 14.6 % (ref 11.5–15.5)
WBC: 6.9 10*3/uL (ref 4.0–10.5)
nRBC: 0 % (ref 0.0–0.2)

## 2023-08-29 LAB — RAPID URINE DRUG SCREEN, HOSP PERFORMED
Amphetamines: NOT DETECTED
Barbiturates: NOT DETECTED
Benzodiazepines: NOT DETECTED
Cocaine: NOT DETECTED
Opiates: NOT DETECTED
Tetrahydrocannabinol: POSITIVE — AB

## 2023-08-29 LAB — GLUCOSE, CAPILLARY
Glucose-Capillary: 147 mg/dL — ABNORMAL HIGH (ref 70–99)
Glucose-Capillary: 120 mg/dL — ABNORMAL HIGH (ref 70–99)
Glucose-Capillary: 142 mg/dL — ABNORMAL HIGH (ref 70–99)
Glucose-Capillary: 82 mg/dL (ref 70–99)

## 2023-08-29 LAB — HIV ANTIBODY (ROUTINE TESTING W REFLEX): HIV Screen 4th Generation wRfx: NONREACTIVE

## 2023-08-29 LAB — PROCALCITONIN: Procalcitonin: 0.31 ng/mL

## 2023-08-29 LAB — TSH: TSH: 6.349 u[IU]/mL — ABNORMAL HIGH (ref 0.350–4.500)

## 2023-08-29 MED ORDER — AMOXICILLIN-POT CLAVULANATE 875-125 MG PO TABS
1.0000 | ORAL_TABLET | Freq: Two times a day (BID) | ORAL | Status: DC
  Administered 2023-08-29 – 2023-08-31 (×5): 1 via ORAL
  Filled 2023-08-29 (×5): qty 1

## 2023-08-29 MED ORDER — CLONIDINE HCL 0.2 MG PO TABS
0.3000 mg | ORAL_TABLET | Freq: Two times a day (BID) | ORAL | Status: DC
  Administered 2023-08-29 – 2023-08-31 (×5): 0.3 mg via ORAL
  Filled 2023-08-29 (×5): qty 1

## 2023-08-29 MED ORDER — GUAIFENESIN 100 MG/5ML PO LIQD
5.0000 mL | ORAL | Status: DC | PRN
  Administered 2023-08-29 – 2023-08-31 (×7): 5 mL via ORAL
  Filled 2023-08-29 (×7): qty 10

## 2023-08-29 MED ORDER — METOPROLOL TARTRATE 5 MG/5ML IV SOLN: 5.0000 mg | INTRAVENOUS | Status: DC | PRN

## 2023-08-29 MED ORDER — IPRATROPIUM-ALBUTEROL 0.5-2.5 (3) MG/3ML IN SOLN
3.0000 mL | RESPIRATORY_TRACT | Status: DC | PRN
  Administered 2023-08-30: 3 mL via RESPIRATORY_TRACT
  Filled 2023-08-29: qty 3

## 2023-08-29 MED ORDER — ACETAMINOPHEN 500 MG PO TABS
1000.0000 mg | ORAL_TABLET | Freq: Once | ORAL | Status: AC
  Administered 2023-08-29: 1000 mg via ORAL
  Filled 2023-08-29: qty 2

## 2023-08-29 MED ORDER — SODIUM CHLORIDE 0.9 % IV SOLN: INTRAVENOUS | Status: AC

## 2023-08-29 MED ORDER — LORATADINE 10 MG PO TABS
10.0000 mg | ORAL_TABLET | Freq: Every day | ORAL | Status: DC
  Administered 2023-08-29 – 2023-08-31 (×3): 10 mg via ORAL
  Filled 2023-08-29 (×3): qty 1

## 2023-08-29 MED ORDER — SENNOSIDES-DOCUSATE SODIUM 8.6-50 MG PO TABS
1.0000 | ORAL_TABLET | Freq: Every evening | ORAL | Status: DC | PRN
  Administered 2023-08-30: 1 via ORAL
  Filled 2023-08-29: qty 1

## 2023-08-29 NOTE — Progress Notes (Signed)
 PROGRESS NOTE    Francisco Carey  OZD:[id number] DOB: [birth year] DOA: 08/27/2023 PCP: Dianah Fort, PA    Brief Narrative:  38 year old with history of DM2, uncontrolled HTN, obesity, HLD comes to the hospital for hypertensive urgency.  Recently seen by Dr. Berry Bristol from cardiology on 5/16 and he was prescribed clonidine , labetalol  and Hyzaar.  Upon admission his systolic blood pressure was in 180s.   Assessment & Plan:  Principal Problem:   Hypertensive urgency     Hypertensive urgency History of uncontrolled hypertension - Patient's home medication including clonidine , labetalol  has been resumed.  Hyzaar on hold due to mild AKI.  2D echo showed moderate LVH, EF 65%.  Will continue to titrate blood pressure medications as necessary.  IV as needed has been ordered - Check TSH, UDS.   Elevated BNP with concerns of diastolic CHF - Echo shows moderate LVH with preserved EF.  Due to AKI holding off on diuretics   Pneumonia -Chest x-ray showing right upper lobe opacity.  Currently on azithromycin, will add 5 days of Augmentin.  Bronchodilators as needed.   Acute kidney injury -Baseline creatinine 1.2, admission creatinine 1.78.  Slowly trending up.  Renal ultrasound is negative.  Hold holding off on nephrotoxic drugs.   Type 2 diabetes- -A1c 6.4.  On Amaryl, sliding scale and Accu-Cheks.    DVT prophylaxis: heparin injection 5,000 Units Start: 08/28/23 1400    Code Status: Full Code Family Communication:   Status is: Inpatient Continue hospital stay for at least next 24 hours to ensure blood pressure trends better    Subjective: Seen at bedside no complaints besides occasional elevated blood pressures   Examination:  General exam: Appears calm and comfortable  Respiratory system: Clear to auscultation. Respiratory effort normal. Cardiovascular system: S1 & S2 heard, RRR. No JVD, murmurs, rubs, gallops or clicks. No pedal edema. Gastrointestinal system: Abdomen is  nondistended, soft and nontender. No organomegaly or masses felt. Normal bowel sounds heard. Central nervous system: Alert and oriented. No focal neurological deficits. Extremities: Symmetric 5 x 5 power. Skin: No rashes, lesions or ulcers Psychiatry: Judgement and insight appear normal. Mood & affect appropriate.                Diet Orders (From admission, onward)     Start     Ordered   08/28/23 0902  Diet Carb Modified Fluid consistency: Thin; Room service appropriate? Yes  Diet effective now       Question Answer Comment  Diet-HS Snack? Nothing   Calorie Level Medium 1600-2000   Fluid consistency: Thin   Room service appropriate? Yes      08/28/23 0903            Objective: Vitals:   08/29/23 0112 08/29/23 0239 08/29/23 0242 08/29/23 0446  BP: (!) 175/111 (!) 175/111  (!) 156/105  Pulse: 89   90  Resp: 16  16 18   Temp: 99.4 F (37.4 C)   98.8 F (37.1 C)  TempSrc:    Oral  SpO2: 100%   100%  Weight:      Height:        Intake/Output Summary (Last 24 hours) at 08/29/2023 1054 Last data filed at 08/29/2023 1044 Gross per 24 hour  Intake 1680 ml  Output --  Net 1680 ml   Filed Weights   08/27/23 1950  Weight: 106.6 kg    Scheduled Meds:  amoxicillin-clavulanate  1 tablet Oral Q12H   aspirin  EC  81 mg Oral Daily  azithromycin  250 mg Oral Daily   cloNIDine   0.3 mg Oral BID   glimepiride  5 mg Oral Q breakfast   heparin  5,000 Units Subcutaneous Q8H   insulin  aspart  0-15 Units Subcutaneous TID WC   insulin  aspart  0-5 Units Subcutaneous QHS   labetalol   200 mg Oral BID   Continuous Infusions:  sodium chloride  75 mL/hr at 08/29/23 1051    Nutritional status     Body mass index is 30.17 kg/m.  Data Reviewed:   CBC: Recent Labs  Lab 08/27/23 1958 08/29/23 0446  WBC 8.4 6.9  HGB 11.8* 11.5*  HCT 36.3* 35.8*  MCV 86.6 86.7  PLT 352 310   Basic Metabolic Panel: Recent Labs  Lab 08/27/23 1958 08/29/23 0446  NA 137 135  K  4.5 4.0  CL 103 102  CO2 25 25  GLUCOSE 127* 152*  BUN 52* 41*  CREATININE 1.78* 1.93*  CALCIUM 9.0 8.2*   GFR: Estimated Creatinine Clearance: 69.5 mL/min (A) (by C-G formula based on SCr of 1.93 mg/dL (H)). Liver Function Tests: No results for input(s): "AST", "ALT", "ALKPHOS", "BILITOT", "PROT", "ALBUMIN" in the last 168 hours. No results for input(s): "LIPASE", "AMYLASE" in the last 168 hours. No results for input(s): "AMMONIA" in the last 168 hours. Coagulation Profile: No results for input(s): "INR", "PROTIME" in the last 168 hours. Cardiac Enzymes: No results for input(s): "CKTOTAL", "CKMB", "CKMBINDEX", "TROPONINI" in the last 168 hours. BNP (last 3 results) Recent Labs    08/27/23 2324  PROBNP 1,396.0*   HbA1C: Recent Labs    08/28/23 1045  HGBA1C 6.4*   CBG: Recent Labs  Lab 08/28/23 1126 08/28/23 1607 08/28/23 2158 08/29/23 0737  GLUCAP 157* 189* 145* 147*   Lipid Profile: No results for input(s): "CHOL", "HDL", "LDLCALC", "TRIG", "CHOLHDL", "LDLDIRECT" in the last 72 hours. Thyroid Function Tests: Recent Labs    08/29/23 0836  TSH 6.349*   Anemia Panel: No results for input(s): "VITAMINB12", "FOLATE", "FERRITIN", "TIBC", "IRON", "RETICCTPCT" in the last 72 hours. Sepsis Labs: Recent Labs  Lab 08/29/23 0836  PROCALCITON 0.31    Recent Results (from the past 240 hours)  Resp panel by RT-PCR (RSV, Flu A&B, Covid) Anterior Nasal Swab     Status: None   Collection Time: 08/28/23  9:33 AM   Specimen: Anterior Nasal Swab  Result Value Ref Range Status   SARS Coronavirus 2 by RT PCR NEGATIVE NEGATIVE Final    Comment: (NOTE) SARS-CoV-2 target nucleic acids are NOT DETECTED.  The SARS-CoV-2 RNA is generally detectable in upper respiratory specimens during the acute phase of infection. The lowest concentration of SARS-CoV-2 viral copies this assay can detect is 138 copies/mL. A negative result does not preclude SARS-Cov-2 infection and should not  be used as the sole basis for treatment or other patient management decisions. A negative result may occur with  improper specimen collection/handling, submission of specimen other than nasopharyngeal swab, presence of viral mutation(s) within the areas targeted by this assay, and inadequate number of viral copies(<138 copies/mL). A negative result must be combined with clinical observations, patient history, and epidemiological information. The expected result is Negative.  Fact Sheet for Patients:  BloggerCourse.com  Fact Sheet for Healthcare Providers:  SeriousBroker.it  This test is no t yet approved or cleared by the [place]  FDA and  has been authorized for detection and/or diagnosis of SARS-CoV-2 by FDA under an Emergency Use Authorization (EUA). This EUA will remain  in effect (meaning this  test can be used) for the duration of the COVID-19 declaration under Section 564(b)(1) of the Act, 21 U.S.C.section 360bbb-3(b)(1), unless the authorization is terminated  or revoked sooner.       Influenza A by PCR NEGATIVE NEGATIVE Final   Influenza B by PCR NEGATIVE NEGATIVE Final    Comment: (NOTE) The Xpert Xpress SARS-CoV-2/FLU/RSV plus assay is intended as an aid in the diagnosis of influenza from Nasopharyngeal swab specimens and should not be used as a sole basis for treatment. Nasal washings and aspirates are unacceptable for Xpert Xpress SARS-CoV-2/FLU/RSV testing.  Fact Sheet for Patients: BloggerCourse.com  Fact Sheet for Healthcare Providers: SeriousBroker.it  This test is not yet approved or cleared by the [place]  FDA and has been authorized for detection and/or diagnosis of SARS-CoV-2 by FDA under an Emergency Use Authorization (EUA). This EUA will remain in effect (meaning this test can be used) for the duration of the COVID-19 declaration under Section  564(b)(1) of the Act, 21 U.S.C. section 360bbb-3(b)(1), unless the authorization is terminated or revoked.     Resp Syncytial Virus by PCR NEGATIVE NEGATIVE Final    Comment: (NOTE) Fact Sheet for Patients: BloggerCourse.com  Fact Sheet for Healthcare Providers: SeriousBroker.it  This test is not yet approved or cleared by the [place]  FDA and has been authorized for detection and/or diagnosis of SARS-CoV-2 by FDA under an Emergency Use Authorization (EUA). This EUA will remain in effect (meaning this test can be used) for the duration of the COVID-19 declaration under Section 564(b)(1) of the Act, 21 U.S.C. section 360bbb-3(b)(1), unless the authorization is terminated or revoked.  Performed at [facility], [address] [place], [place] [address]   Culture, blood (Routine X 2) w Reflex to ID Panel     Status: None (Preliminary result)   Collection Time: 08/28/23 10:32 AM   Specimen: BLOOD LEFT ARM  Result Value Ref Range Status   Specimen Description   Final    BLOOD LEFT ARM Performed at [facility], [address] [place], [place] [address]    Special Requests   Final    BOTTLES DRAWN AEROBIC AND ANAEROBIC Blood Culture adequate volume Performed at [facility], [address], [place], [place] [address]    Culture   Final    NO GROWTH < 24 HOURS Performed at [facility], [address] [place], [place] [address]    Report Status PENDING  Incomplete  Culture, blood (Routine X 2) w Reflex to ID Panel     Status: None (Preliminary result)   Collection Time: 08/28/23 10:45 AM   Specimen: BLOOD RIGHT HAND  Result Value Ref Range Status   Specimen Description   Final    BLOOD RIGHT HAND Performed at [facility], [address] [place], [place] [address]    Special Requests   Final    BOTTLES DRAWN AEROBIC AND ANAEROBIC Blood Culture results may not be  optimal due to an inadequate volume of blood received in culture bottles Performed at [facility], [address] [place], [place] [address]    Culture   Final    NO GROWTH < 24 HOURS Performed at [facility], [address], [place], [place] [address]    Report Status PENDING  Incomplete         Radiology Studies: ECHOCARDIOGRAM COMPLETE Result Date: 08/28/2023    ECHOCARDIOGRAM REPORT   Patient Name:   Ardean  Chip Coulter Date of Exam: 08/28/2023 Medical Rec #:  [id number]          Height:       74.0 in Accession #:    [id number]         Weight:       235.0 lb Date of Birth:  22-Aug-1985         BSA:          2.328 m Patient Age:    37 years           BP:           166/93 mmHg Patient Gender: M                  HR:           90 bpm. Exam Location:  Inpatient Procedure: 2D Echo, Cardiac Doppler and Color Doppler (Both Spectral and Color            Flow Doppler were utilized during procedure). Indications:    Congestive heart failure  History:        Patient has no prior history of Echocardiogram examinations.                 TIA; Risk Factors:Hypertension and Diabetes.  Sonographer:    Willey Harrier Referring Phys: [id number] MIR M [facility] IMPRESSIONS  1. Left ventricular ejection fraction, by estimation, is 60 to 65%. The left ventricle has normal function. The left ventricle has no regional wall motion abnormalities. There is moderate concentric left ventricular hypertrophy. Left ventricular diastolic parameters were normal.  2. Right ventricular systolic function is normal. The right ventricular size is normal.  3. The mitral valve is normal in structure. Trivial mitral valve regurgitation. No evidence of mitral stenosis.  4. The aortic valve is normal in structure. Aortic valve regurgitation is not visualized. No aortic stenosis is present.  5. The inferior vena cava is dilated in size with >50% respiratory variability, suggesting right atrial pressure of 8 mmHg.  FINDINGS  Left Ventricle: Left ventricular ejection fraction, by estimation, is 60 to 65%. The left ventricle has normal function. The left ventricle has no regional wall motion abnormalities. The left ventricular internal cavity size was normal in size. There is  moderate concentric left ventricular hypertrophy. Left ventricular diastolic parameters were normal. Right Ventricle: The right ventricular size is normal. No increase in right ventricular wall thickness. Right ventricular systolic function is normal. Left Atrium: Left atrial size was normal in size. Right Atrium: Right atrial size was normal in size. Pericardium: There is no evidence of pericardial effusion. Mitral Valve: The mitral valve is normal in structure. Trivial mitral valve regurgitation. No evidence of mitral valve stenosis. MV peak gradient, 8.1 mmHg. The mean mitral valve gradient is 3.0 mmHg. Tricuspid Valve: The tricuspid valve is normal in structure. Tricuspid valve regurgitation is trivial. No evidence of tricuspid stenosis. Aortic Valve: The aortic valve is normal in structure. Aortic valve regurgitation is not visualized. No aortic stenosis is present. Aortic valve peak gradient measures 16.3 mmHg. Pulmonic Valve: The pulmonic valve was normal in structure. Pulmonic valve regurgitation is not visualized. No evidence of pulmonic stenosis. Aorta: The aortic root is normal in size and structure. Venous: The inferior vena cava is dilated in size with greater than 50% respiratory variability, suggesting right atrial pressure of 8 mmHg. IAS/Shunts: No atrial level shunt detected by color flow Doppler.  LEFT VENTRICLE PLAX 2D LVIDd:  5.10 cm LVIDs:         3.50 cm LV PW:         1.40 cm LV IVS:        1.30 cm LVOT diam:     2.20 cm LV SV:         94 LV SV Index:   40 LVOT Area:     3.80 cm  RIGHT VENTRICLE             IVC RV Basal diam:  3.20 cm     IVC diam: 2.50 cm RV S prime:     19.50 cm/s TAPSE (M-mode): 2.8 cm LEFT ATRIUM              Index        RIGHT ATRIUM           Index LA Vol (A2C):   94.9 ml 40.77 ml/m  RA Area:     17.30 cm LA Vol (A4C):   55.0 ml 23.63 ml/m  RA Volume:   45.80 ml  19.68 ml/m LA Biplane Vol: 72.9 ml 31.32 ml/m  AORTIC VALVE AV Area (Vmax): 2.84 cm AV Vmax:        202.00 cm/s AV Peak Grad:   16.3 mmHg LVOT Vmax:      151.00 cm/s LVOT Vmean:     98.200 cm/s LVOT VTI:       0.246 m  AORTA Ao Root diam: 3.30 cm Ao Asc diam:  3.00 cm MITRAL VALVE                TRICUSPID VALVE MV Area (PHT): 5.93 cm     TR Peak grad:   29.6 mmHg MV Area VTI:   3.12 cm     TR Vmax:        272.00 cm/s MV Peak grad:  8.1 mmHg MV Mean grad:  3.0 mmHg     SHUNTS MV Vmax:       1.42 m/s     Systemic VTI:  0.25 m MV Vmean:      85.9 cm/s    Systemic Diam: 2.20 cm MV Decel Time: 128 msec MV E velocity: 118.00 cm/s MV A velocity: 93.10 cm/s MV E/A ratio:  1.27 Aditya Sabharwal Electronically signed by Alwin Baars Signature Date/Time: 08/28/2023/5:28:20 PM    Final    DG Chest Portable 1 View Result Date: 08/28/2023 CLINICAL DATA:  Hypertension EXAM: PORTABLE CHEST 1 VIEW COMPARISON:  09/20/2022 FINDINGS: Cardiac shadow is stable. Lungs are well aerated bilaterally. Patchy airspace opacity is noted in the right upper lobe which may represent early infiltrate. No bony abnormality is noted. IMPRESSION: Patchy right upper lobe opacity. Electronically Signed   By: Violeta Grey M.D.   On: 08/28/2023 00:53   US  Renal Result Date: 08/28/2023 CLINICAL DATA:  Acute renal injury EXAM: RENAL / URINARY TRACT ULTRASOUND COMPLETE COMPARISON:  None Available. FINDINGS: Right Kidney: Renal measurements: 12.8 x 5.9 x 6.6 cm. = volume: 260 mL. Echogenicity within normal limits. No mass or hydronephrosis visualized. Left Kidney: Renal measurements: 12.6 x 6.7 x 6.0 cm. = volume: 267 mL. Echogenicity within normal limits. No mass or hydronephrosis visualized. Bladder: Appears normal for degree of bladder distention. Other: None. IMPRESSION: Normal  appearing kidneys bilaterally. Electronically Signed   By: Violeta Grey M.D.   On: 08/28/2023 00:20           LOS: 1 day   Time spent= 35 mins    Jarid Sasso C  Ariel Begun, MD [facility]  If 7PM-7AM, please contact night-coverage  08/29/2023, 10:54 AM

## 2023-08-30 DIAGNOSIS — I160 Hypertensive urgency: Secondary | ICD-10-CM | POA: Diagnosis not present

## 2023-08-30 LAB — CBC
HCT: 36.6 % — ABNORMAL LOW (ref 39.0–52.0)
Hemoglobin: 11.7 g/dL — ABNORMAL LOW (ref 13.0–17.0)
MCH: 28.1 pg (ref 26.0–34.0)
MCHC: 32 g/dL (ref 30.0–36.0)
MCV: 87.8 fL (ref 80.0–100.0)
Platelets: 292 10*3/uL (ref 150–400)
RBC: 4.17 MIL/uL — ABNORMAL LOW (ref 4.22–5.81)
RDW: 14.7 % (ref 11.5–15.5)
WBC: 7 10*3/uL (ref 4.0–10.5)
nRBC: 0 % (ref 0.0–0.2)

## 2023-08-30 LAB — GLUCOSE, CAPILLARY
Glucose-Capillary: 121 mg/dL — ABNORMAL HIGH (ref 70–99)
Glucose-Capillary: 103 mg/dL — ABNORMAL HIGH (ref 70–99)
Glucose-Capillary: 91 mg/dL (ref 70–99)
Glucose-Capillary: 84 mg/dL (ref 70–99)

## 2023-08-30 LAB — BASIC METABOLIC PANEL WITH GFR
Anion gap: 8 (ref 5–15)
BUN: 35 mg/dL — ABNORMAL HIGH (ref 6–20)
CO2: 22 mmol/L (ref 22–32)
Calcium: 8.2 mg/dL — ABNORMAL LOW (ref 8.9–10.3)
Chloride: 104 mmol/L (ref 98–111)
Creatinine, Ser: 1.59 mg/dL — ABNORMAL HIGH (ref 0.61–1.24)
GFR, Estimated: 58 mL/min — ABNORMAL LOW (ref >60)
Glucose, Bld: 98 mg/dL (ref 70–99)
Potassium: 4 mmol/L (ref 3.5–5.1)
Sodium: 134 mmol/L — ABNORMAL LOW (ref 135–145)

## 2023-08-30 LAB — MAGNESIUM: Magnesium: 2 mg/dL (ref 1.7–2.4)

## 2023-08-30 LAB — PHOSPHORUS: Phosphorus: 4.6 mg/dL (ref 2.5–4.6)

## 2023-08-30 LAB — T4, FREE: Free T4: 0.89 ng/dL (ref 0.61–1.12)

## 2023-08-30 MED ORDER — AMLODIPINE BESYLATE 5 MG PO TABS
10.0000 mg | ORAL_TABLET | Freq: Every day | ORAL | Status: DC
  Administered 2023-08-30 – 2023-08-31 (×2): 10 mg via ORAL
  Filled 2023-08-30 (×2): qty 2

## 2023-08-30 NOTE — Plan of Care (Signed)
   Problem: Clinical Measurements: Goal: Ability to maintain clinical measurements within normal limits will improve Outcome: Progressing

## 2023-08-30 NOTE — Progress Notes (Signed)
 PROGRESS NOTE    Francisco Carey  ZOX:[id number] DOB: [birth year] DOA: 08/27/2023 PCP: Dianah Fort, PA    Brief Narrative:  38 year old with history of DM2, uncontrolled HTN, obesity, HLD comes to the hospital for hypertensive urgency.  Recently seen by Dr. Berry Bristol from cardiology on 5/16 and he was prescribed clonidine , labetalol  and Hyzaar.  Upon admission his systolic blood pressure was in 180s.   Assessment & Plan:  Principal Problem:   Hypertensive urgency     Hypertensive urgency History of uncontrolled hypertension - Patient's home medication including clonidine , labetalol  has been resumed.  Hyzaar on hold due to mild AKI.  2D echo showed moderate LVH, EF 65%.  TSH elevated, free T4 pending.  UDS positive for THC - Continue clonidine  0.3 mg twice daily, labetalol  200 mg twice daily.  Add Norvasc  10 mg   Elevated BNP with concerns of diastolic CHF - Echo shows moderate LVH with preserved EF.  Due to AKI holding off on diuretics   Pneumonia -Chest x-ray showing right upper lobe opacity.  Currently on azithromycin, will add 5 days of Augmentin.  Bronchodilators as needed.   Acute kidney injury -Baseline creatinine 1.2, admission creatinine 1.78.  Renal ultrasound negative.  Creatinine improving 1.59 today   Type 2 diabetes- -A1c 6.4.  On Amaryl, sliding scale and Accu-Cheks.    DVT prophylaxis: heparin injection 5,000 Units Start: 08/28/23 1400    Code Status: Full Code Family Communication:   Status is: Inpatient Continue hospital stay for at least next 24 hours to ensure blood pressure trends better    Subjective:  Still significantly elevated blood pressure.  Feeling slightly better today.  Examination:  General exam: Appears calm and comfortable  Respiratory system: Clear to auscultation. Respiratory effort normal. Cardiovascular system: S1 & S2 heard, RRR. No JVD, murmurs, rubs, gallops or clicks. No pedal edema. Gastrointestinal system: Abdomen is  nondistended, soft and nontender. No organomegaly or masses felt. Normal bowel sounds heard. Central nervous system: Alert and oriented. No focal neurological deficits. Extremities: Symmetric 5 x 5 power. Skin: No rashes, lesions or ulcers Psychiatry: Judgement and insight appear normal. Mood & affect appropriate.                Diet Orders (From admission, onward)     Start     Ordered   08/28/23 0902  Diet Carb Modified Fluid consistency: Thin; Room service appropriate? Yes  Diet effective now       Question Answer Comment  Diet-HS Snack? Nothing   Calorie Level Medium 1600-2000   Fluid consistency: Thin   Room service appropriate? Yes      08/28/23 0903            Objective: Vitals:   08/30/23 0452 08/30/23 0545 08/30/23 0558 08/30/23 1114  BP: (!) 162/121  (!) 165/130 (!) 144/94  Pulse: (!) 101  94 90  Resp:      Temp: 98.6 F (37 C)  98.9 F (37.2 C) 98 F (36.7 C)  TempSrc: Oral   Oral  SpO2: 100% 99%  100%  Weight:      Height:        Intake/Output Summary (Last 24 hours) at 08/30/2023 1200 Last data filed at 08/30/2023 1135 Gross per 24 hour  Intake 2803.15 ml  Output --  Net 2803.15 ml   Filed Weights   08/27/23 1950  Weight: 106.6 kg    Scheduled Meds:  amLODipine   10 mg Oral Daily   amoxicillin-clavulanate  1  tablet Oral Q12H   aspirin  EC  81 mg Oral Daily   azithromycin  250 mg Oral Daily   cloNIDine   0.3 mg Oral BID   glimepiride  5 mg Oral Q breakfast   heparin  5,000 Units Subcutaneous Q8H   insulin  aspart  0-15 Units Subcutaneous TID WC   insulin  aspart  0-5 Units Subcutaneous QHS   labetalol   200 mg Oral BID   loratadine  10 mg Oral Daily   Continuous Infusions:  Nutritional status     Body mass index is 30.17 kg/m.  Data Reviewed:   CBC: Recent Labs  Lab 08/27/23 1958 08/29/23 0446 08/30/23 0634  WBC 8.4 6.9 7.0  HGB 11.8* 11.5* 11.7*  HCT 36.3* 35.8* 36.6*  MCV 86.6 86.7 87.8  PLT 352 310 292   Basic  Metabolic Panel: Recent Labs  Lab 08/27/23 1958 08/29/23 0446 08/30/23 0611  NA 137 135 134*  K 4.5 4.0 4.0  CL 103 102 104  CO2 25 25 22   GLUCOSE 127* 152* 98  BUN 52* 41* 35*  CREATININE 1.78* 1.93* 1.59*  CALCIUM 9.0 8.2* 8.2*  MG  --   --  2.0  PHOS  --   --  4.6   GFR: Estimated Creatinine Clearance: 84.4 mL/min (A) (by C-G formula based on SCr of 1.59 mg/dL (H)). Liver Function Tests: No results for input(s): "AST", "ALT", "ALKPHOS", "BILITOT", "PROT", "ALBUMIN" in the last 168 hours. No results for input(s): "LIPASE", "AMYLASE" in the last 168 hours. No results for input(s): "AMMONIA" in the last 168 hours. Coagulation Profile: No results for input(s): "INR", "PROTIME" in the last 168 hours. Cardiac Enzymes: No results for input(s): "CKTOTAL", "CKMB", "CKMBINDEX", "TROPONINI" in the last 168 hours. BNP (last 3 results) Recent Labs    08/27/23 2324  PROBNP 1,396.0*   HbA1C: Recent Labs    08/28/23 1045  HGBA1C 6.4*   CBG: Recent Labs  Lab 08/29/23 0737 08/29/23 1131 08/29/23 1611 08/29/23 2137 08/30/23 0801  GLUCAP 147* 120* 142* 82 121*   Lipid Profile: No results for input(s): "CHOL", "HDL", "LDLCALC", "TRIG", "CHOLHDL", "LDLDIRECT" in the last 72 hours. Thyroid Function Tests: Recent Labs    08/29/23 0836  TSH 6.349*   Anemia Panel: No results for input(s): "VITAMINB12", "FOLATE", "FERRITIN", "TIBC", "IRON", "RETICCTPCT" in the last 72 hours. Sepsis Labs: Recent Labs  Lab 08/29/23 0836  PROCALCITON 0.31    Recent Results (from the past 240 hours)  Resp panel by RT-PCR (RSV, Flu A&B, Covid) Anterior Nasal Swab     Status: None   Collection Time: 08/28/23  9:33 AM   Specimen: Anterior Nasal Swab  Result Value Ref Range Status   SARS Coronavirus 2 by RT PCR NEGATIVE NEGATIVE Final    Comment: (NOTE) SARS-CoV-2 target nucleic acids are NOT DETECTED.  The SARS-CoV-2 RNA is generally detectable in upper respiratory specimens during the  acute phase of infection. The lowest concentration of SARS-CoV-2 viral copies this assay can detect is 138 copies/mL. A negative result does not preclude SARS-Cov-2 infection and should not be used as the sole basis for treatment or other patient management decisions. A negative result may occur with  improper specimen collection/handling, submission of specimen other than nasopharyngeal swab, presence of viral mutation(s) within the areas targeted by this assay, and inadequate number of viral copies(<138 copies/mL). A negative result must be combined with clinical observations, patient history, and epidemiological information. The expected result is Negative.  Fact Sheet for Patients:  BloggerCourse.com  Fact Sheet for Healthcare Providers:  SeriousBroker.it  This test is no t yet approved or cleared by the [place]  FDA and  has been authorized for detection and/or diagnosis of SARS-CoV-2 by FDA under an Emergency Use Authorization (EUA). This EUA will remain  in effect (meaning this test can be used) for the duration of the COVID-19 declaration under Section 564(b)(1) of the Act, 21 U.S.C.section 360bbb-3(b)(1), unless the authorization is terminated  or revoked sooner.       Influenza A by PCR NEGATIVE NEGATIVE Final   Influenza B by PCR NEGATIVE NEGATIVE Final    Comment: (NOTE) The Xpert Xpress SARS-CoV-2/FLU/RSV plus assay is intended as an aid in the diagnosis of influenza from Nasopharyngeal swab specimens and should not be used as a sole basis for treatment. Nasal washings and aspirates are unacceptable for Xpert Xpress SARS-CoV-2/FLU/RSV testing.  Fact Sheet for Patients: BloggerCourse.com  Fact Sheet for Healthcare Providers: SeriousBroker.it  This test is not yet approved or cleared by the [place]  FDA and has been authorized for detection and/or  diagnosis of SARS-CoV-2 by FDA under an Emergency Use Authorization (EUA). This EUA will remain in effect (meaning this test can be used) for the duration of the COVID-19 declaration under Section 564(b)(1) of the Act, 21 U.S.C. section 360bbb-3(b)(1), unless the authorization is terminated or revoked.     Resp Syncytial Virus by PCR NEGATIVE NEGATIVE Final    Comment: (NOTE) Fact Sheet for Patients: BloggerCourse.com  Fact Sheet for Healthcare Providers: SeriousBroker.it  This test is not yet approved or cleared by the [place]  FDA and has been authorized for detection and/or diagnosis of SARS-CoV-2 by FDA under an Emergency Use Authorization (EUA). This EUA will remain in effect (meaning this test can be used) for the duration of the COVID-19 declaration under Section 564(b)(1) of the Act, 21 U.S.C. section 360bbb-3(b)(1), unless the authorization is terminated or revoked.  Performed at [facility], [address] [place], [place] [address]   Culture, blood (Routine X 2) w Reflex to ID Panel     Status: None (Preliminary result)   Collection Time: 08/28/23 10:32 AM   Specimen: BLOOD LEFT ARM  Result Value Ref Range Status   Specimen Description   Final    BLOOD LEFT ARM Performed at [facility], [address], [place], [place] [address]    Special Requests   Final    BOTTLES DRAWN AEROBIC AND ANAEROBIC Blood Culture adequate volume Performed at [facility], [address] [place], [place] [address]    Culture   Final    NO GROWTH 2 DAYS Performed at [facility], [address] [place], [place] [address]    Report Status PENDING  Incomplete  Culture, blood (Routine X 2) w Reflex to ID Panel     Status: None (Preliminary result)   Collection Time: 08/28/23 10:45 AM   Specimen: BLOOD RIGHT HAND  Result Value Ref Range Status   Specimen Description   Final     BLOOD RIGHT HAND Performed at [facility], [address], [place], [place] [address]    Special Requests   Final    BOTTLES DRAWN AEROBIC AND ANAEROBIC Blood Culture results may not be optimal due to an inadequate volume of blood received in culture bottles Performed at [facility], [address], [place], [place] [address]    Culture   Final    NO GROWTH 2 DAYS  Performed at [facility], [address] [place], [place] [address]    Report Status PENDING  Incomplete         Radiology Studies: ECHOCARDIOGRAM COMPLETE Result Date: 08/28/2023    ECHOCARDIOGRAM REPORT   Patient Name:   Francisco Carey Date of Exam: 08/28/2023 Medical Rec #:  [id number]          Height:       74.0 in Accession #:    [id number]         Weight:       235.0 lb Date of Birth:  1985/05/16         BSA:          2.328 m Patient Age:    37 years           BP:           166/93 mmHg Patient Gender: M                  HR:           90 bpm. Exam Location:  Inpatient Procedure: 2D Echo, Cardiac Doppler and Color Doppler (Both Spectral and Color            Flow Doppler were utilized during procedure). Indications:    Congestive heart failure  History:        Patient has no prior history of Echocardiogram examinations.                 TIA; Risk Factors:Hypertension and Diabetes.  Sonographer:    Willey Harrier Referring Phys: [id number] MIR M [facility] IMPRESSIONS  1. Left ventricular ejection fraction, by estimation, is 60 to 65%. The left ventricle has normal function. The left ventricle has no regional wall motion abnormalities. There is moderate concentric left ventricular hypertrophy. Left ventricular diastolic parameters were normal.  2. Right ventricular systolic function is normal. The right ventricular size is normal.  3. The mitral valve is normal in structure. Trivial mitral valve regurgitation. No evidence of mitral stenosis.  4. The aortic valve is normal in structure. Aortic valve  regurgitation is not visualized. No aortic stenosis is present.  5. The inferior vena cava is dilated in size with >50% respiratory variability, suggesting right atrial pressure of 8 mmHg. FINDINGS  Left Ventricle: Left ventricular ejection fraction, by estimation, is 60 to 65%. The left ventricle has normal function. The left ventricle has no regional wall motion abnormalities. The left ventricular internal cavity size was normal in size. There is  moderate concentric left ventricular hypertrophy. Left ventricular diastolic parameters were normal. Right Ventricle: The right ventricular size is normal. No increase in right ventricular wall thickness. Right ventricular systolic function is normal. Left Atrium: Left atrial size was normal in size. Right Atrium: Right atrial size was normal in size. Pericardium: There is no evidence of pericardial effusion. Mitral Valve: The mitral valve is normal in structure. Trivial mitral valve regurgitation. No evidence of mitral valve stenosis. MV peak gradient, 8.1 mmHg. The mean mitral valve gradient is 3.0 mmHg. Tricuspid Valve: The tricuspid valve is normal in structure. Tricuspid valve regurgitation is trivial. No evidence of tricuspid stenosis. Aortic Valve: The aortic valve is normal in structure. Aortic valve regurgitation is not visualized. No aortic stenosis is present. Aortic valve peak gradient measures 16.3 mmHg. Pulmonic Valve: The pulmonic valve was normal in structure. Pulmonic valve regurgitation is not visualized. No evidence of pulmonic stenosis. Aorta: The aortic root is normal in size  and structure. Venous: The inferior vena cava is dilated in size with greater than 50% respiratory variability, suggesting right atrial pressure of 8 mmHg. IAS/Shunts: No atrial level shunt detected by color flow Doppler.  LEFT VENTRICLE PLAX 2D LVIDd:         5.10 cm LVIDs:         3.50 cm LV PW:         1.40 cm LV IVS:        1.30 cm LVOT diam:     2.20 cm LV SV:         94 LV  SV Index:   40 LVOT Area:     3.80 cm  RIGHT VENTRICLE             IVC RV Basal diam:  3.20 cm     IVC diam: 2.50 cm RV S prime:     19.50 cm/s TAPSE (M-mode): 2.8 cm LEFT ATRIUM             Index        RIGHT ATRIUM           Index LA Vol (A2C):   94.9 ml 40.77 ml/m  RA Area:     17.30 cm LA Vol (A4C):   55.0 ml 23.63 ml/m  RA Volume:   45.80 ml  19.68 ml/m LA Biplane Vol: 72.9 ml 31.32 ml/m  AORTIC VALVE AV Area (Vmax): 2.84 cm AV Vmax:        202.00 cm/s AV Peak Grad:   16.3 mmHg LVOT Vmax:      151.00 cm/s LVOT Vmean:     98.200 cm/s LVOT VTI:       0.246 m  AORTA Ao Root diam: 3.30 cm Ao Asc diam:  3.00 cm MITRAL VALVE                TRICUSPID VALVE MV Area (PHT): 5.93 cm     TR Peak grad:   29.6 mmHg MV Area VTI:   3.12 cm     TR Vmax:        272.00 cm/s MV Peak grad:  8.1 mmHg MV Mean grad:  3.0 mmHg     SHUNTS MV Vmax:       1.42 m/s     Systemic VTI:  0.25 m MV Vmean:      85.9 cm/s    Systemic Diam: 2.20 cm MV Decel Time: 128 msec MV E velocity: 118.00 cm/s MV A velocity: 93.10 cm/s MV E/A ratio:  1.27 Aditya Sabharwal Electronically signed by Alwin Baars Signature Date/Time: 08/28/2023/5:28:20 PM    Final            LOS: 2 days   Time spent= 35 mins    Maggie Schooner, MD [facility]  If 7PM-7AM, please contact night-coverage  08/30/2023, 12:00 PM

## 2023-08-31 DIAGNOSIS — I160 Hypertensive urgency: Secondary | ICD-10-CM | POA: Diagnosis not present

## 2023-08-31 LAB — CBC
HCT: 34 % — ABNORMAL LOW (ref 39.0–52.0)
Hemoglobin: 11 g/dL — ABNORMAL LOW (ref 13.0–17.0)
MCH: 28.2 pg (ref 26.0–34.0)
MCHC: 32.4 g/dL (ref 30.0–36.0)
MCV: 87.2 fL (ref 80.0–100.0)
Platelets: 283 10*3/uL (ref 150–400)
RBC: 3.9 MIL/uL — ABNORMAL LOW (ref 4.22–5.81)
RDW: 14.7 % (ref 11.5–15.5)
WBC: 5.9 10*3/uL (ref 4.0–10.5)
nRBC: 0 % (ref 0.0–0.2)

## 2023-08-31 LAB — BASIC METABOLIC PANEL WITH GFR
Anion gap: 9 (ref 5–15)
BUN: 37 mg/dL — ABNORMAL HIGH (ref 6–20)
CO2: 20 mmol/L — ABNORMAL LOW (ref 22–32)
Calcium: 8 mg/dL — ABNORMAL LOW (ref 8.9–10.3)
Chloride: 103 mmol/L (ref 98–111)
Creatinine, Ser: 1.7 mg/dL — ABNORMAL HIGH (ref 0.61–1.24)
GFR, Estimated: 53 mL/min — ABNORMAL LOW (ref >60)
Glucose, Bld: 77 mg/dL (ref 70–99)
Potassium: 4.2 mmol/L (ref 3.5–5.1)
Sodium: 132 mmol/L — ABNORMAL LOW (ref 135–145)

## 2023-08-31 LAB — GLUCOSE, CAPILLARY
Glucose-Capillary: 98 mg/dL (ref 70–99)
Glucose-Capillary: 71 mg/dL (ref 70–99)

## 2023-08-31 LAB — MAGNESIUM: Magnesium: 2.1 mg/dL (ref 1.7–2.4)

## 2023-08-31 MED ORDER — ASPIRIN 81 MG PO TBEC: 81.0000 mg | DELAYED_RELEASE_TABLET | Freq: Every day | ORAL | 0 refills | Status: AC

## 2023-08-31 MED ORDER — LABETALOL HCL 200 MG PO TABS: 200.0000 mg | ORAL_TABLET | Freq: Two times a day (BID) | ORAL | 0 refills | Status: DC

## 2023-08-31 MED ORDER — AZITHROMYCIN 250 MG PO TABS: 250.0000 mg | ORAL_TABLET | Freq: Every day | ORAL | 0 refills | Status: AC

## 2023-08-31 MED ORDER — ORAL CARE MOUTH RINSE: 15.0000 mL | OROMUCOSAL | Status: DC | PRN

## 2023-08-31 MED ORDER — AMLODIPINE BESYLATE 10 MG PO TABS: 10.0000 mg | ORAL_TABLET | Freq: Every day | ORAL | 0 refills | Status: DC

## 2023-08-31 MED ORDER — CLONIDINE HCL 0.3 MG PO TABS: 0.3000 mg | ORAL_TABLET | Freq: Two times a day (BID) | ORAL | 0 refills | Status: AC

## 2023-08-31 MED ORDER — DEXTROMETHORPHAN POLISTIREX ER 30 MG/5ML PO SUER: 30.0000 mg | Freq: Two times a day (BID) | ORAL | Status: DC | PRN

## 2023-08-31 MED ORDER — AMOXICILLIN-POT CLAVULANATE 875-125 MG PO TABS: 1.0000 | ORAL_TABLET | Freq: Two times a day (BID) | ORAL | 0 refills | Status: AC

## 2023-08-31 NOTE — Plan of Care (Signed)
  Problem: Health Behavior/Discharge Planning: Goal: Ability to manage health-related needs will improve Outcome: Adequate for Discharge   Problem: Clinical Measurements: Goal: Ability to maintain clinical measurements within normal limits will improve Outcome: Adequate for Discharge Goal: Will remain free from infection Outcome: Adequate for Discharge Goal: Respiratory complications will improve Outcome: Adequate for Discharge   Problem: Activity: Goal: Risk for activity intolerance will decrease Outcome: Adequate for Discharge   Problem: Pain Managment: Goal: General experience of comfort will improve and/or be controlled Outcome: Adequate for Discharge   Problem: Education: Goal: Ability to describe self-care measures that may prevent or decrease complications (Diabetes Survival Skills Education) will improve Outcome: Adequate for Discharge Goal: Individualized Educational Video(s) Outcome: Adequate for Discharge   Problem: Coping: Goal: Ability to adjust to condition or change in health will improve Outcome: Adequate for Discharge   Problem: Fluid Volume: Goal: Ability to maintain a balanced intake and output will improve Outcome: Adequate for Discharge   Problem: Health Behavior/Discharge Planning: Goal: Ability to identify and utilize available resources and services will improve Outcome: Adequate for Discharge Goal: Ability to manage health-related needs will improve Outcome: Adequate for Discharge   Problem: Metabolic: Goal: Ability to maintain appropriate glucose levels will improve Outcome: Adequate for Discharge   Problem: Nutritional: Goal: Maintenance of adequate nutrition will improve Outcome: Adequate for Discharge Goal: Progress toward achieving an optimal weight will improve Outcome: Adequate for Discharge   Problem: Tissue Perfusion: Goal: Adequacy of tissue perfusion will improve Outcome: Adequate for Discharge

## 2023-08-31 NOTE — TOC Transition Note (Signed)
 Transition of Care [facility]) - Discharge Note   Patient Details  Name: Francisco Carey MRN: [id number] Date of Birth: 05-02-1985  [facility] CM/SW Contact:  Levie Ream, RN Phone Number: 08/31/2023, 9:01 AM   Clinical Narrative:    D/C orders received; no TOC needs.   Final next level of care: Home/Self Care Barriers to Discharge: No Barriers Identified   Patient Goals and CMS Choice Patient states their goals for this hospitalization and ongoing recovery are:: Home CMS Medicare.gov Compare Post Acute Care list provided to:: Patient Choice offered to / list presented to : Patient [place] ownership interest in [facility].provided to:: Patient    Discharge Placement                       Discharge Plan and Services Additional resources added to the After Visit Summary for                                       Social Drivers of Health (SDOH) Interventions SDOH Screenings   Food Insecurity: No Food Insecurity (08/28/2023)  Housing: Low Risk  (08/28/2023)  Transportation Needs: No Transportation Needs (08/28/2023)  Utilities: Not At Risk (08/28/2023)  Financial Resource Strain: Low Risk  (07/22/2023)   Received from [facility]  Social Connections: Unknown (08/28/2023)  Tobacco Use: Low Risk  (08/28/2023)     Readmission Risk Interventions     No data to display

## 2023-08-31 NOTE — Discharge Summary (Signed)
 Physician Discharge Summary  Francisco Carey NWG:[id number] DOB: 04/29/1985 DOA: 08/27/2023  PCP: Dianah Fort, PA  Admit date: 08/27/2023 Discharge date: 08/31/2023  Admitted From: Home Disposition: Home Home  Recommendations for Outpatient Follow-up:  Follow up with PCP in 1-2 weeks Please obtain BMP/CBC in one week your next doctors visit.  Current blood pressure medications-clonidine , labetalol , Norvasc .  Clonidine  has been increased during this hospitalization. Eventually plan to add losartan  depending on his renal function. Advised to keep log of blood pressures so medications can be adjusted and blood pressure can be normalized over next 1-2 weeks Advised weight loss diet and exercise Complete 3 more days of p.o. antibiotics   Discharge Condition: Stable CODE STATUS: Full code Diet recommendation: Low-salt  Brief/Interim Summary: Brief Narrative:  38 year old with history of DM2, uncontrolled HTN, obesity, HLD comes to the hospital for hypertensive urgency.  Recently seen by Dr. Berry Bristol from cardiology on 5/16 and he was prescribed clonidine , labetalol  and Hyzaar.  Upon admission his systolic blood pressure was in 180s.  Eventually clonidine  was increased, labetalol  was continued and Norvasc  was added.  Losartan  and HCTZ was held due to AKI which is slowly improving.  Today blood pressure is much better, breathing is better.  He will be given antibiotics to complete his treatment course for pneumonia. Medically stable for discharge   Assessment & Plan:  Principal Problem:   Hypertensive urgency     Hypertensive urgency, improved History of uncontrolled hypertension - Patient's home medication including clonidine , labetalol  has been resumed.  Hyzaar on hold due to mild AKI.  2D echo showed moderate LVH, EF 65%.  TSH elevated, free T4 normal.  UDS positive for THC - Currently on clonidine , labetalol  and Norvasc .  Blood pressure is slowly improving.  Advised to log  blood pressure at home/he can follow-up with outpatient PCP.  Discussed will be normalized over next 1-2 weeks.  Eventually can add losartan  depending on renal function   Elevated BNP with concerns of diastolic CHF - Echo shows moderate LVH with preserved EF.  Need better blood pressure control.   Pneumonia -Chest x-ray showing right upper lobe opacity.,  Total 5-day antibiotic course   Acute kidney injury -Baseline creatinine 1.2, admission creatinine 1.78.  Renal ultrasound negative.  Today 1.7.  Recommend outpatient follow-up with PCP and oral hydration   Type 2 diabetes- -A1c 6.4.  On Amaryl    DVT prophylaxis: heparin injection 5,000 Units Start: 08/28/23 1400    Code Status: Full Code Family Communication:   Status is: Inpatient Discharge today    Subjective:  Still significantly elevated blood pressure.  Feeling slightly better today.  Examination:  General exam: Appears calm and comfortable  Respiratory system: Clear to auscultation. Respiratory effort normal. Cardiovascular system: S1 & S2 heard, RRR. No JVD, murmurs, rubs, gallops or clicks. No pedal edema. Gastrointestinal system: Abdomen is nondistended, soft and nontender. No organomegaly or masses felt. Normal bowel sounds heard. Central nervous system: Alert and oriented. No focal neurological deficits. Extremities: Symmetric 5 x 5 power. Skin: No rashes, lesions or ulcers Psychiatry: Judgement and insight appear normal. Mood & affect appropriate.    Discharge Diagnoses:  Principal Problem:   Hypertensive urgency      Discharge Exam: Vitals:   08/31/23 0240 08/31/23 0601  BP:  (!) 149/99  Pulse:  87  Resp: 20   Temp: 98.4 F (36.9 C) 98.5 F (36.9 C)  SpO2:  98%   Vitals:   08/30/23 2205 08/31/23 0002 08/31/23 0240  08/31/23 0601  BP:  (!) 147/98  (!) 149/99  Pulse:  86  87  Resp: 17 18 20    Temp:  98.1 F (36.7 C) 98.4 F (36.9 C) 98.5 F (36.9 C)  TempSrc:  Oral Oral Oral  SpO2:  99%   98%  Weight:      Height:          Discharge Instructions   Allergies as of 08/31/2023   No Known Allergies      Medication List     STOP taking these medications    losartan -hydrochlorothiazide 100-25 MG tablet Commonly known as: HYZAAR       TAKE these medications    amLODipine  10 MG tablet Commonly known as: NORVASC  Take 1 tablet (10 mg total) by mouth daily.   amoxicillin-clavulanate 875-125 MG tablet Commonly known as: AUGMENTIN Take 1 tablet by mouth every 12 (twelve) hours for 3 days.   aspirin  EC 81 MG tablet Take 1 tablet (81 mg total) by mouth daily. Swallow whole. What changed: additional instructions   azithromycin 250 MG tablet Commonly known as: ZITHROMAX Take 1 tablet (250 mg total) by mouth daily for 3 days.   cloNIDine  0.3 MG tablet Commonly known as: CATAPRES  Take 1 tablet (0.3 mg total) by mouth 2 (two) times daily. What changed:  medication strength how much to take   dextromethorphan 30 MG/5ML liquid Commonly known as: Delsym Take 5 mLs (30 mg total) by mouth 2 (two) times daily as needed for cough.   Farxiga 5 MG Tabs tablet Generic drug: dapagliflozin propanediol Take 5 mg by mouth daily.   glimepiride 1 MG tablet Commonly known as: AMARYL Take 1 mg by mouth daily with breakfast.   labetalol  200 MG tablet Commonly known as: NORMODYNE  Take 1 tablet (200 mg total) by mouth 2 (two) times daily.   sildenafil  50 MG tablet Commonly known as: Viagra  Take 1 tablet (50 mg total) by mouth daily as needed for erectile dysfunction.        Follow-up Information     Connect with your PCP/Specialist as discussed. Schedule an appointment as soon as possible for a visit .   Contact information: [email or web address] Call our physician referral line at [phone].        Dianah Fort, PA Follow up in 1 week(s).   Specialty: Physician Assistant Contact information: [address] [place]  [place] [address] [phone]                No Known Allergies  You were cared for by a hospitalist during your hospital stay. If you have any questions about your discharge medications or the care you received while you were in the hospital after you are discharged, you can call the unit and asked to speak with the hospitalist on call if the hospitalist that took care of you is not available. Once you are discharged, your primary care physician will handle any further medical issues. Please note that no refills for any discharge medications will be authorized once you are discharged, as it is imperative that you return to your primary care physician (or establish a relationship with a primary care physician if you do not have one) for your aftercare needs so that they can reassess your need for medications and monitor your lab values.  You were cared for by a hospitalist during your hospital stay. If you have any questions about your discharge medications or the care you received while you were in the hospital after  you are discharged, you can call the unit and asked to speak with the hospitalist on call if the hospitalist that took care of you is not available. Once you are discharged, your primary care physician will handle any further medical issues. Please note that NO REFILLS for any discharge medications will be authorized once you are discharged, as it is imperative that you return to your primary care physician (or establish a relationship with a primary care physician if you do not have one) for your aftercare needs so that they can reassess your need for medications and monitor your lab values.  Please request your Prim.MD to go over all Hospital Tests and Procedure/Radiological results at the follow up, please get all Hospital records sent to your Prim MD by signing hospital release before you go home.  Get CBC, CMP, 2 view Chest X ray checked  by Primary MD during your next visit or SNF MD  in 5-7 days ( we routinely change or add medications that can affect your baseline labs and fluid status, therefore we recommend that you get the mentioned basic workup next visit with your PCP, your PCP may decide not to get them or add new tests based on their clinical decision)  On your next visit with your primary care physician please Get Medicines reviewed and adjusted.  If you experience worsening of your admission symptoms, develop shortness of breath, life threatening emergency, suicidal or homicidal thoughts you must seek medical attention immediately by calling 911 or calling your MD immediately  if symptoms less severe.  You Must read complete instructions/literature along with all the possible adverse reactions/side effects for all the Medicines you take and that have been prescribed to you. Take any new Medicines after you have completely understood and accpet all the possible adverse reactions/side effects.   Do not drive, operate heavy machinery, perform activities at heights, swimming or participation in water activities or provide baby sitting services if your were admitted for syncope or siezures until you have seen by Primary MD or a Neurologist and advised to do so again.  Do not drive when taking Pain medications.   Procedures/Studies: ECHOCARDIOGRAM COMPLETE Result Date: 08/28/2023    ECHOCARDIOGRAM REPORT   Patient Name:   Francisco Carey Date of Exam: 08/28/2023 Medical Rec #:  [id number]          Height:       74.0 in Accession #:    [id number]         Weight:       235.0 lb Date of Birth:  1985-05-02         BSA:          2.328 m Patient Age:    37 years           BP:           166/93 mmHg Patient Gender: M                  HR:           90 bpm. Exam Location:  Inpatient Procedure: 2D Echo, Cardiac Doppler and Color Doppler (Both Spectral and Color            Flow Doppler were utilized during procedure). Indications:    Congestive heart failure  History:        Patient has  no prior history of Echocardiogram examinations.  TIA; Risk Factors:Hypertension and Diabetes.  Sonographer:    Willey Harrier Referring Phys: [id number] MIR M [facility] IMPRESSIONS  1. Left ventricular ejection fraction, by estimation, is 60 to 65%. The left ventricle has normal function. The left ventricle has no regional wall motion abnormalities. There is moderate concentric left ventricular hypertrophy. Left ventricular diastolic parameters were normal.  2. Right ventricular systolic function is normal. The right ventricular size is normal.  3. The mitral valve is normal in structure. Trivial mitral valve regurgitation. No evidence of mitral stenosis.  4. The aortic valve is normal in structure. Aortic valve regurgitation is not visualized. No aortic stenosis is present.  5. The inferior vena cava is dilated in size with >50% respiratory variability, suggesting right atrial pressure of 8 mmHg. FINDINGS  Left Ventricle: Left ventricular ejection fraction, by estimation, is 60 to 65%. The left ventricle has normal function. The left ventricle has no regional wall motion abnormalities. The left ventricular internal cavity size was normal in size. There is  moderate concentric left ventricular hypertrophy. Left ventricular diastolic parameters were normal. Right Ventricle: The right ventricular size is normal. No increase in right ventricular wall thickness. Right ventricular systolic function is normal. Left Atrium: Left atrial size was normal in size. Right Atrium: Right atrial size was normal in size. Pericardium: There is no evidence of pericardial effusion. Mitral Valve: The mitral valve is normal in structure. Trivial mitral valve regurgitation. No evidence of mitral valve stenosis. MV peak gradient, 8.1 mmHg. The mean mitral valve gradient is 3.0 mmHg. Tricuspid Valve: The tricuspid valve is normal in structure. Tricuspid valve regurgitation is trivial. No evidence of tricuspid stenosis. Aortic  Valve: The aortic valve is normal in structure. Aortic valve regurgitation is not visualized. No aortic stenosis is present. Aortic valve peak gradient measures 16.3 mmHg. Pulmonic Valve: The pulmonic valve was normal in structure. Pulmonic valve regurgitation is not visualized. No evidence of pulmonic stenosis. Aorta: The aortic root is normal in size and structure. Venous: The inferior vena cava is dilated in size with greater than 50% respiratory variability, suggesting right atrial pressure of 8 mmHg. IAS/Shunts: No atrial level shunt detected by color flow Doppler.  LEFT VENTRICLE PLAX 2D LVIDd:         5.10 cm LVIDs:         3.50 cm LV PW:         1.40 cm LV IVS:        1.30 cm LVOT diam:     2.20 cm LV SV:         94 LV SV Index:   40 LVOT Area:     3.80 cm  RIGHT VENTRICLE             IVC RV Basal diam:  3.20 cm     IVC diam: 2.50 cm RV S prime:     19.50 cm/s TAPSE (M-mode): 2.8 cm LEFT ATRIUM             Index        RIGHT ATRIUM           Index LA Vol (A2C):   94.9 ml 40.77 ml/m  RA Area:     17.30 cm LA Vol (A4C):   55.0 ml 23.63 ml/m  RA Volume:   45.80 ml  19.68 ml/m LA Biplane Vol: 72.9 ml 31.32 ml/m  AORTIC VALVE AV Area (Vmax): 2.84 cm AV Vmax:        202.00 cm/s AV Peak Grad:  16.3 mmHg LVOT Vmax:      151.00 cm/s LVOT Vmean:     98.200 cm/s LVOT VTI:       0.246 m  AORTA Ao Root diam: 3.30 cm Ao Asc diam:  3.00 cm MITRAL VALVE                TRICUSPID VALVE MV Area (PHT): 5.93 cm     TR Peak grad:   29.6 mmHg MV Area VTI:   3.12 cm     TR Vmax:        272.00 cm/s MV Peak grad:  8.1 mmHg MV Mean grad:  3.0 mmHg     SHUNTS MV Vmax:       1.42 m/s     Systemic VTI:  0.25 m MV Vmean:      85.9 cm/s    Systemic Diam: 2.20 cm MV Decel Time: 128 msec MV E velocity: 118.00 cm/s MV A velocity: 93.10 cm/s MV E/A ratio:  1.27 Aditya Sabharwal Electronically signed by Alwin Baars Signature Date/Time: 08/28/2023/5:28:20 PM    Final    DG Chest Portable 1 View Result Date: 08/28/2023 CLINICAL  DATA:  Hypertension EXAM: PORTABLE CHEST 1 VIEW COMPARISON:  09/20/2022 FINDINGS: Cardiac shadow is stable. Lungs are well aerated bilaterally. Patchy airspace opacity is noted in the right upper lobe which may represent early infiltrate. No bony abnormality is noted. IMPRESSION: Patchy right upper lobe opacity. Electronically Signed   By: Violeta Grey M.D.   On: 08/28/2023 00:53   US  Renal Result Date: 08/28/2023 CLINICAL DATA:  Acute renal injury EXAM: RENAL / URINARY TRACT ULTRASOUND COMPLETE COMPARISON:  None Available. FINDINGS: Right Kidney: Renal measurements: 12.8 x 5.9 x 6.6 cm. = volume: 260 mL. Echogenicity within normal limits. No mass or hydronephrosis visualized. Left Kidney: Renal measurements: 12.6 x 6.7 x 6.0 cm. = volume: 267 mL. Echogenicity within normal limits. No mass or hydronephrosis visualized. Bladder: Appears normal for degree of bladder distention. Other: None. IMPRESSION: Normal appearing kidneys bilaterally. Electronically Signed   By: Violeta Grey M.D.   On: 08/28/2023 00:20     The results of significant diagnostics from this hospitalization (including imaging, microbiology, ancillary and laboratory) are listed below for reference.     Microbiology: Recent Results (from the past 240 hours)  Resp panel by RT-PCR (RSV, Flu A&B, Covid) Anterior Nasal Swab     Status: None   Collection Time: 08/28/23  9:33 AM   Specimen: Anterior Nasal Swab  Result Value Ref Range Status   SARS Coronavirus 2 by RT PCR NEGATIVE NEGATIVE Final    Comment: (NOTE) SARS-CoV-2 target nucleic acids are NOT DETECTED.  The SARS-CoV-2 RNA is generally detectable in upper respiratory specimens during the acute phase of infection. The lowest concentration of SARS-CoV-2 viral copies this assay can detect is 138 copies/mL. A negative result does not preclude SARS-Cov-2 infection and should not be used as the sole basis for treatment or other patient management decisions. A negative result may  occur with  improper specimen collection/handling, submission of specimen other than nasopharyngeal swab, presence of viral mutation(s) within the areas targeted by this assay, and inadequate number of viral copies(<138 copies/mL). A negative result must be combined with clinical observations, patient history, and epidemiological information. The expected result is Negative.  Fact Sheet for Patients:  BloggerCourse.com  Fact Sheet for Healthcare Providers:  SeriousBroker.it  This test is no t yet approved or cleared by the [place]  FDA and  has been  authorized for detection and/or diagnosis of SARS-CoV-2 by FDA under an Emergency Use Authorization (EUA). This EUA will remain  in effect (meaning this test can be used) for the duration of the COVID-19 declaration under Section 564(b)(1) of the Act, 21 U.S.C.section 360bbb-3(b)(1), unless the authorization is terminated  or revoked sooner.       Influenza A by PCR NEGATIVE NEGATIVE Final   Influenza B by PCR NEGATIVE NEGATIVE Final    Comment: (NOTE) The Xpert Xpress SARS-CoV-2/FLU/RSV plus assay is intended as an aid in the diagnosis of influenza from Nasopharyngeal swab specimens and should not be used as a sole basis for treatment. Nasal washings and aspirates are unacceptable for Xpert Xpress SARS-CoV-2/FLU/RSV testing.  Fact Sheet for Patients: BloggerCourse.com  Fact Sheet for Healthcare Providers: SeriousBroker.it  This test is not yet approved or cleared by the [place]  FDA and has been authorized for detection and/or diagnosis of SARS-CoV-2 by FDA under an Emergency Use Authorization (EUA). This EUA will remain in effect (meaning this test can be used) for the duration of the COVID-19 declaration under Section 564(b)(1) of the Act, 21 U.S.C. section 360bbb-3(b)(1), unless the authorization is terminated  or revoked.     Resp Syncytial Virus by PCR NEGATIVE NEGATIVE Final    Comment: (NOTE) Fact Sheet for Patients: BloggerCourse.com  Fact Sheet for Healthcare Providers: SeriousBroker.it  This test is not yet approved or cleared by the [place]  FDA and has been authorized for detection and/or diagnosis of SARS-CoV-2 by FDA under an Emergency Use Authorization (EUA). This EUA will remain in effect (meaning this test can be used) for the duration of the COVID-19 declaration under Section 564(b)(1) of the Act, 21 U.S.C. section 360bbb-3(b)(1), unless the authorization is terminated or revoked.  Performed at [facility], [address] [place], [place] [address]   Culture, blood (Routine X 2) w Reflex to ID Panel     Status: None (Preliminary result)   Collection Time: 08/28/23 10:32 AM   Specimen: BLOOD LEFT ARM  Result Value Ref Range Status   Specimen Description   Final    BLOOD LEFT ARM Performed at [facility], [address], [place], [place] [address]    Special Requests   Final    BOTTLES DRAWN AEROBIC AND ANAEROBIC Blood Culture adequate volume Performed at [facility], [address], [place], [place] [address]    Culture   Final    NO GROWTH 3 DAYS Performed at [facility], [address], [place], [place] [address]    Report Status PENDING  Incomplete  Culture, blood (Routine X 2) w Reflex to ID Panel     Status: None (Preliminary result)   Collection Time: 08/28/23 10:45 AM   Specimen: BLOOD RIGHT HAND  Result Value Ref Range Status   Specimen Description   Final    BLOOD RIGHT HAND Performed at [facility], [address] [place], [place] [address]    Special Requests   Final    BOTTLES DRAWN AEROBIC AND ANAEROBIC Blood Culture results may not be optimal due to an inadequate volume of blood received in culture bottles Performed at [facility], [address], [place], [place] [address]    Culture   Final    NO GROWTH 3 DAYS Performed at [facility], [address], [place], [place] [address]    Report Status PENDING  Incomplete     Labs:  BNP (last 3 results) No results for input(s): "BNP" in the last 8760 hours. Basic Metabolic Panel: Recent Labs  Lab 08/27/23 1958 08/29/23 0446 08/30/23 0611 08/31/23 0551  NA 137 135 134* 132*  K 4.5 4.0 4.0 4.2  CL 103 102 104 103  CO2 25 25 22  20*  GLUCOSE 127* 152* 98 77  BUN 52* 41* 35* 37*  CREATININE 1.78* 1.93* 1.59* 1.70*  CALCIUM 9.0 8.2* 8.2* 8.0*  MG  --   --  2.0 2.1  PHOS  --   --  4.6  --    Liver Function Tests: No results for input(s): "AST", "ALT", "ALKPHOS", "BILITOT", "PROT", "ALBUMIN" in the last 168 hours. No results for input(s): "LIPASE", "AMYLASE" in the last 168 hours. No results for input(s): "AMMONIA" in the last 168 hours. CBC: Recent Labs  Lab 08/27/23 1958 08/29/23 0446 08/30/23 0634 08/31/23 0551  WBC 8.4 6.9 7.0 5.9  HGB 11.8* 11.5* 11.7* 11.0*  HCT 36.3* 35.8* 36.6* 34.0*  MCV 86.6 86.7 87.8 87.2  PLT 352 310 292 283   Cardiac Enzymes: No results for input(s): "CKTOTAL", "CKMB", "CKMBINDEX", "TROPONINI" in the last 168 hours. BNP: Invalid input(s): "POCBNP" CBG: Recent Labs  Lab 08/30/23 0801 08/30/23 1206 08/30/23 1631 08/30/23 2106 08/31/23 0810  GLUCAP 121* 103* 91 84 98   D-Dimer No results for input(s): "DDIMER" in the last 72 hours. Hgb A1c Recent Labs    08/28/23 1045  HGBA1C 6.4*   Lipid Profile No results for input(s): "CHOL", "HDL", "LDLCALC", "TRIG", "CHOLHDL", "LDLDIRECT" in the last 72 hours. Thyroid function studies Recent Labs    08/29/23 0836  TSH 6.349*   Anemia work up No results for input(s): "VITAMINB12", "FOLATE", "FERRITIN", "TIBC", "IRON", "RETICCTPCT" in the last 72 hours. Urinalysis    Component Value Date/Time   COLORURINE YELLOW 08/27/2023 2324    APPEARANCEUR CLEAR 08/27/2023 2324   LABSPEC 1.013 08/27/2023 2324   PHURINE 6.5 08/27/2023 2324   GLUCOSEU 500 (A) 08/27/2023 2324   HGBUR LARGE (A) 08/27/2023 2324   BILIRUBINUR NEGATIVE 08/27/2023 2324   KETONESUR NEGATIVE 08/27/2023 2324   PROTEINUR >300 (A) 08/27/2023 2324   UROBILINOGEN 0.2 05/25/2012 0151   NITRITE NEGATIVE 08/27/2023 2324   LEUKOCYTESUR NEGATIVE 08/27/2023 2324   Sepsis Labs Recent Labs  Lab 08/27/23 1958 08/29/23 0446 08/30/23 0634 08/31/23 0551  WBC 8.4 6.9 7.0 5.9   Microbiology Recent Results (from the past 240 hours)  Resp panel by RT-PCR (RSV, Flu A&B, Covid) Anterior Nasal Swab     Status: None   Collection Time: 08/28/23  9:33 AM   Specimen: Anterior Nasal Swab  Result Value Ref Range Status   SARS Coronavirus 2 by RT PCR NEGATIVE NEGATIVE Final    Comment: (NOTE) SARS-CoV-2 target nucleic acids are NOT DETECTED.  The SARS-CoV-2 RNA is generally detectable in upper respiratory specimens during the acute phase of infection. The lowest concentration of SARS-CoV-2 viral copies this assay can detect is 138 copies/mL. A negative result does not preclude SARS-Cov-2 infection and should not be used as the sole basis for treatment or other patient management decisions. A negative result may occur with  improper specimen collection/handling, submission of specimen other than nasopharyngeal swab, presence of viral mutation(s) within the areas targeted by this assay, and inadequate number of viral copies(<138 copies/mL). A negative result must be combined with clinical observations, patient history, and epidemiological information. The expected result is Negative.  Fact Sheet for Patients:  BloggerCourse.com  Fact Sheet for Healthcare  Providers:  SeriousBroker.it  This test is no t yet approved or cleared by the [place]  FDA and  has been authorized for detection and/or diagnosis of  SARS-CoV-2 by FDA under an Emergency Use Authorization (EUA). This EUA will remain  in effect (meaning this test can be used) for the duration of the COVID-19 declaration under Section 564(b)(1) of the Act, 21 U.S.C.section 360bbb-3(b)(1), unless the authorization is terminated  or revoked sooner.       Influenza A by PCR NEGATIVE NEGATIVE Final   Influenza B by PCR NEGATIVE NEGATIVE Final    Comment: (NOTE) The Xpert Xpress SARS-CoV-2/FLU/RSV plus assay is intended as an aid in the diagnosis of influenza from Nasopharyngeal swab specimens and should not be used as a sole basis for treatment. Nasal washings and aspirates are unacceptable for Xpert Xpress SARS-CoV-2/FLU/RSV testing.  Fact Sheet for Patients: BloggerCourse.com  Fact Sheet for Healthcare Providers: SeriousBroker.it  This test is not yet approved or cleared by the [place]  FDA and has been authorized for detection and/or diagnosis of SARS-CoV-2 by FDA under an Emergency Use Authorization (EUA). This EUA will remain in effect (meaning this test can be used) for the duration of the COVID-19 declaration under Section 564(b)(1) of the Act, 21 U.S.C. section 360bbb-3(b)(1), unless the authorization is terminated or revoked.     Resp Syncytial Virus by PCR NEGATIVE NEGATIVE Final    Comment: (NOTE) Fact Sheet for Patients: BloggerCourse.com  Fact Sheet for Healthcare Providers: SeriousBroker.it  This test is not yet approved or cleared by the [place]  FDA and has been authorized for detection and/or diagnosis of SARS-CoV-2 by FDA under an Emergency Use Authorization (EUA). This EUA will remain in effect (meaning this test can be used) for the duration of the COVID-19 declaration under Section 564(b)(1) of the Act, 21 U.S.C. section 360bbb-3(b)(1), unless the authorization is terminated  or revoked.  Performed at [facility], [address], [place], [place] [address]   Culture, blood (Routine X 2) w Reflex to ID Panel     Status: None (Preliminary result)   Collection Time: 08/28/23 10:32 AM   Specimen: BLOOD LEFT ARM  Result Value Ref Range Status   Specimen Description   Final    BLOOD LEFT ARM Performed at [facility], [address], [place], [place] [address]    Special Requests   Final    BOTTLES DRAWN AEROBIC AND ANAEROBIC Blood Culture adequate volume Performed at [facility], [address] [place], [place] [address]    Culture   Final    NO GROWTH 3 DAYS Performed at [facility], [address], [place], [place] [address]    Report Status PENDING  Incomplete  Culture, blood (Routine X 2) w Reflex to ID Panel     Status: None (Preliminary result)   Collection Time: 08/28/23 10:45 AM   Specimen: BLOOD RIGHT HAND  Result Value Ref Range Status   Specimen Description   Final    BLOOD RIGHT HAND Performed at [facility], [address], [place], [place] [address]    Special Requests   Final    BOTTLES DRAWN AEROBIC AND ANAEROBIC Blood Culture results may not be optimal due to an inadequate volume of blood received in culture bottles Performed at [facility], [address] [place], [place] [address]    Culture   Final    NO GROWTH 3 DAYS Performed at [facility], [address] [place], [place] [address]    Report Status PENDING  Incomplete     Time coordinating discharge:  I have spent 35 minutes face to face with the patient and on the ward discussing the patients care, assessment, plan and disposition with other care givers. >50% of the time was devoted counseling the patient about the risks and benefits of treatment/Discharge disposition and coordinating care.   SIGNED:   Maggie Schooner, MD  [facility] 08/31/2023, 9:32 AM   If 7PM-7AM, please  contact night-coverage

## 2023-08-31 NOTE — Progress Notes (Signed)
 Patient given discharge, follow up, and medication instructions, verbalized understanding, IV and telemetry monitor removed, friend to transport home

## 2023-09-02 LAB — CULTURE, BLOOD (ROUTINE X 2)
Culture: NO GROWTH
Culture: NO GROWTH
Special Requests: ADEQUATE

## 2023-09-10 ENCOUNTER — Ambulatory Visit: Attending: Nurse Practitioner | Admitting: Nurse Practitioner

## 2023-09-10 ENCOUNTER — Encounter: Payer: Self-pay | Admitting: Nurse Practitioner

## 2023-09-10 VITALS — BP 148/98 | HR 77 | Ht 74.0 in | Wt 228.0 lb

## 2023-09-10 DIAGNOSIS — E782 Mixed hyperlipidemia: Secondary | ICD-10-CM | POA: Diagnosis not present

## 2023-09-10 DIAGNOSIS — I1A0 Resistant hypertension: Secondary | ICD-10-CM | POA: Diagnosis not present

## 2023-09-10 DIAGNOSIS — E1165 Type 2 diabetes mellitus with hyperglycemia: Secondary | ICD-10-CM

## 2023-09-10 DIAGNOSIS — I119 Hypertensive heart disease without heart failure: Secondary | ICD-10-CM | POA: Diagnosis not present

## 2023-09-10 DIAGNOSIS — I493 Ventricular premature depolarization: Secondary | ICD-10-CM | POA: Diagnosis not present

## 2023-09-10 NOTE — Progress Notes (Signed)
 Office Visit    Patient Name: Francisco Carey Date of Encounter: 09/10/2023  Primary Care Provider:  Dianah Fort, PA Primary Cardiologist:  Knox Perl, MD  Chief Complaint    38 year old male with a history of hypertension, hypertensive heart disease, PVCs. hyperlipidemia, and type 2 diabetes who presents for hospital follow-up related to hypertension.  Past Medical History    Past Medical History:  Diagnosis Date   Diabetes mellitus without complication (HCC)    ED (erectile dysfunction)    Edema of both lower extremities    HLD (hyperlipidemia)    Hypertension    Overweight    Past Surgical History:  Procedure Laterality Date   KNEE SURGERY     KNEE SURGERY      Allergies  Allergies  Allergen Reactions   Amlodipine  Swelling    Body didn't respond well to it. Swelling, edema lower legs      Labs/Other Studies Reviewed    The following studies were reviewed today:  Cardiac Studies & Procedures   ______________________________________________________________________________________________     ECHOCARDIOGRAM  ECHOCARDIOGRAM COMPLETE 08/28/2023  Narrative ECHOCARDIOGRAM REPORT    Patient Name:   Francisco Carey Date of Exam: 08/28/2023 Medical Rec #:  [id number]          Height:       74.0 in Accession #:    [id number]         Weight:       235.0 lb Date of Birth:  02-14-1986         BSA:          2.328 m Patient Age:    37 years           BP:           166/93 mmHg Patient Gender: M                  HR:           90 bpm. Exam Location:  Inpatient  Procedure: 2D Echo, Cardiac Doppler and Color Doppler (Both Spectral and Color Flow Doppler were utilized during procedure).  Indications:    Congestive heart failure  History:        Patient has no prior history of Echocardiogram examinations. TIA; Risk Factors:Hypertension and Diabetes.  Sonographer:    Willey Harrier Referring Phys: [id number] MIR M [facility]  IMPRESSIONS   1. Left  ventricular ejection fraction, by estimation, is 60 to 65%. The left ventricle has normal function. The left ventricle has no regional wall motion abnormalities. There is moderate concentric left ventricular hypertrophy. Left ventricular diastolic parameters were normal. 2. Right ventricular systolic function is normal. The right ventricular size is normal. 3. The mitral valve is normal in structure. Trivial mitral valve regurgitation. No evidence of mitral stenosis. 4. The aortic valve is normal in structure. Aortic valve regurgitation is not visualized. No aortic stenosis is present. 5. The inferior vena cava is dilated in size with >50% respiratory variability, suggesting right atrial pressure of 8 mmHg.  FINDINGS Left Ventricle: Left ventricular ejection fraction, by estimation, is 60 to 65%. The left ventricle has normal function. The left ventricle has no regional wall motion abnormalities. The left ventricular internal cavity size was normal in size. There is moderate concentric left ventricular hypertrophy. Left ventricular diastolic parameters were normal.  Right Ventricle: The right ventricular size is normal. No increase in right ventricular wall thickness. Right ventricular systolic function is normal.  Left Atrium: Left atrial size  was normal in size.  Right Atrium: Right atrial size was normal in size.  Pericardium: There is no evidence of pericardial effusion.  Mitral Valve: The mitral valve is normal in structure. Trivial mitral valve regurgitation. No evidence of mitral valve stenosis. MV peak gradient, 8.1 mmHg. The mean mitral valve gradient is 3.0 mmHg.  Tricuspid Valve: The tricuspid valve is normal in structure. Tricuspid valve regurgitation is trivial. No evidence of tricuspid stenosis.  Aortic Valve: The aortic valve is normal in structure. Aortic valve regurgitation is not visualized. No aortic stenosis is present. Aortic valve peak gradient measures 16.3  mmHg.  Pulmonic Valve: The pulmonic valve was normal in structure. Pulmonic valve regurgitation is not visualized. No evidence of pulmonic stenosis.  Aorta: The aortic root is normal in size and structure.  Venous: The inferior vena cava is dilated in size with greater than 50% respiratory variability, suggesting right atrial pressure of 8 mmHg.  IAS/Shunts: No atrial level shunt detected by color flow Doppler.   LEFT VENTRICLE PLAX 2D LVIDd:         5.10 cm LVIDs:         3.50 cm LV PW:         1.40 cm LV IVS:        1.30 cm LVOT diam:     2.20 cm LV SV:         94 LV SV Index:   40 LVOT Area:     3.80 cm   RIGHT VENTRICLE             IVC RV Basal diam:  3.20 cm     IVC diam: 2.50 cm RV S prime:     19.50 cm/s TAPSE (M-mode): 2.8 cm  LEFT ATRIUM             Index        RIGHT ATRIUM           Index LA Vol (A2C):   94.9 ml 40.77 ml/m  RA Area:     17.30 cm LA Vol (A4C):   55.0 ml 23.63 ml/m  RA Volume:   45.80 ml  19.68 ml/m LA Biplane Vol: 72.9 ml 31.32 ml/m AORTIC VALVE AV Area (Vmax): 2.84 cm AV Vmax:        202.00 cm/s AV Peak Grad:   16.3 mmHg LVOT Vmax:      151.00 cm/s LVOT Vmean:     98.200 cm/s LVOT VTI:       0.246 m  AORTA Ao Root diam: 3.30 cm Ao Asc diam:  3.00 cm  MITRAL VALVE                TRICUSPID VALVE MV Area (PHT): 5.93 cm     TR Peak grad:   29.6 mmHg MV Area VTI:   3.12 cm     TR Vmax:        272.00 cm/s MV Peak grad:  8.1 mmHg MV Mean grad:  3.0 mmHg     SHUNTS MV Vmax:       1.42 m/s     Systemic VTI:  0.25 m MV Vmean:      85.9 cm/s    Systemic Diam: 2.20 cm MV Decel Time: 128 msec MV E velocity: 118.00 cm/s MV A velocity: 93.10 cm/s MV E/A ratio:  1.27  Aditya Sabharwal Electronically signed by Alwin Baars Signature Date/Time: 08/28/2023/5:28:20 PM    Final          ______________________________________________________________________________________________  Recent Labs: 05/29/2023: ALT 15 08/27/2023:  Pro Brain Natriuretic Peptide 1,396.0 08/29/2023: TSH 6.349 08/31/2023: BUN 37; Creatinine, Ser 1.70; Hemoglobin 11.0; Magnesium  2.1; Platelets 283; Potassium 4.2; Sodium 132  Recent Lipid Panel    Component Value Date/Time   CHOL 598 (H) 02/16/2012 0116   TRIG 1,304 (H) 02/16/2012 0116   HDL NOT REPORTED DUE TO HIGH TRIGLYCERIDES 02/16/2012 0116   CHOLHDL NOT REPORTED DUE TO HIGH TRIGLYCERIDES 02/16/2012 0116   VLDL UNABLE TO CALCULATE IF TRIGLYCERIDE OVER 400 mg/dL 29/56/2130 8657   LDLCALC UNABLE TO CALCULATE IF TRIGLYCERIDE OVER 400 mg/dL 84/69/6295 2841    History of Present Illness    38 year old male with the above past medical history including hypertension,  hypertensive heart disease, PVCs, hyperlipidemia, and type 2 diabetes.  He was referred to cardiology for management of hypertension, hyperlipidemia, and for cardiac risk stratification.  He was last seen in the office on 08/22/2023 and was stable from a cardiac standpoint.  He did report significant fluctuations in BP.  Started on clonidine  and labetalol .  He was referred to Dr. Maximo Spar for management of severe hyperlipidemia.  He was hospitalized in May 2025 in the setting of hypertensive urgency.  BNP was elevated.  UDS was positive for THC.  Hyzaar was held due to mild AKI.  Echocardiogram showed EF 60 to 65%, normal LV function, no RWMA, moderate concentric LVH, normal RV systolic function, no significant valvular abnormalities.  Chest x-ray showed right upper lobe opacity, concerning for pneumonia.  He was treated with antibiotics.  Discharged home in stable additional 08/31/2023.  He presents today for follow-up.  Since his last visit and recent hospitalization He has done well from a cardiac standpoint.  He denies any symptoms concerning for angina.  He experienced significant bilateral lower extremity edema in the setting of amlodipine . His PCP discontinued amlodipine  10 mg daily and switched him to amlodipine -olmesartan 5/40 mg  daily.  He has not yet started this medication.  BP is elevated in office today, he is asymptomatic.   Home Medications    Current Outpatient Medications  Medication Sig Dispense Refill   amLODipine -olmesartan (AZOR) 5-40 MG tablet Take 1 tablet by mouth daily. HASN'T STARTED YET     aspirin  EC 81 MG tablet Take 1 tablet (81 mg total) by mouth daily. Swallow whole. 90 tablet 0   cloNIDine  (CATAPRES ) 0.3 MG tablet Take 1 tablet (0.3 mg total) by mouth 2 (two) times daily. 180 tablet 0   FARXIGA 10 MG TABS tablet Take 10 mg by mouth daily.     glimepiride  (AMARYL ) 2 MG tablet Take 2 mg by mouth daily.     rosuvastatin (CRESTOR) 40 MG tablet Take 40 mg by mouth daily.     sildenafil  (VIAGRA ) 50 MG tablet Take 1 tablet (50 mg total) by mouth daily as needed for erectile dysfunction. 20 tablet 0   No current facility-administered medications for this visit.     Review of Systems    He denies chest pain, palpitations, dyspnea, pnd, orthopnea, n, v, dizziness, syncope, edema, weight gain, or early satiety. All other systems reviewed and are otherwise negative except as noted above.   Physical Exam    VS:  BP (!) 148/98   Pulse 77   Ht 6\' 2"  (1.88 m)   Wt 228 lb (103.4 kg)   SpO2 98%   BMI 29.27 kg/m   GEN: Well nourished, well developed, in no acute distress. HEENT: normal. Neck: Supple, no JVD, carotid bruits, or  masses. Cardiac: RRR, no murmurs, rubs, or gallops. No clubbing, cyanosis, edema.  Radials/DP/PT 2+ and equal bilaterally.  Respiratory:  Respirations regular and unlabored, clear to auscultation bilaterally. GI: Soft, nontender, nondistended, BS + x 4. MS: no deformity or atrophy. Skin: warm and dry, no rash. Neuro:  Strength and sensation are intact. Psych: Normal affect.  Accessory Clinical Findings    ECG personally reviewed by me today -    - no EKG in office today.    Lab Results  Component Value Date   WBC 5.9 08/31/2023   HGB 11.0 (L) 08/31/2023   HCT 34.0  (L) 08/31/2023   MCV 87.2 08/31/2023   PLT 283 08/31/2023   Lab Results  Component Value Date   CREATININE 1.70 (H) 08/31/2023   BUN 37 (H) 08/31/2023   NA 132 (L) 08/31/2023   K 4.2 08/31/2023   CL 103 08/31/2023   CO2 20 (L) 08/31/2023   Lab Results  Component Value Date   ALT 15 05/29/2023   AST 17 05/29/2023   ALKPHOS 61 05/29/2023   BILITOT 0.4 05/29/2023   Lab Results  Component Value Date   CHOL 598 (H) 02/16/2012   HDL NOT REPORTED DUE TO HIGH TRIGLYCERIDES 02/16/2012   LDLCALC UNABLE TO CALCULATE IF TRIGLYCERIDE OVER 400 mg/dL 16/04/930   TRIG 3,557 (H) 02/16/2012   CHOLHDL NOT REPORTED DUE TO HIGH TRIGLYCERIDES 02/16/2012    Lab Results  Component Value Date   HGBA1C 6.4 (H) 08/28/2023    Assessment & Plan   1. Hypertension: BP elevated in office today. He experienced significant bilateral lower extremity edema in the setting of amlodipine . His PCP discontinued amlodipine  10 mg daily and switched him to amlodipine -olmesartan 5/40 mg daily.  He has not yet started this medication, though he plans to start today. We discussed ongoing monitoring of BP, goal BP less than 130/80.  He has close follow-up scheduled with his PCP later this month.  If BP remains elevated, consider addition of low-dose carvedilol.  Though he states he did not feel well on labetalol .  Could also consider referral to Pharm.D.  He has reported some GI upset with clonidine  (constipation, bloating).  Advised him to continue to monitor symptoms.  2. Hypertensive heart disease: Echocardiogram in 08/2023 showed EF 60 to 65%, normal LV function, no RWMA, moderate concentric LVH, normal RV systolic function, no significant valvular abnormalities. Euvolemic and well compensated on exam.  Continue to work on BP control as above.  3. PVCs: Denies palpitations. Recent echo stable as above.  Continue to monitor for symptoms.    4. Hyperlipidemia: Total cholesterol of 316 in 06/2023, triglycerides were  significantly elevated at 746, LDL unable to calculate.  He was previously referred to Dr. Maximo Spar for lipids and has appointment scheduled with him on  12/2023.  Continue Crestor.  5. Type 2 diabetes: A1c was 6.4 in 08/2023.  He was recently referred endocrinology per PCP.  6. AKI: Creatinine was 1.59 on 08/30/2023. Improving. He is pending follow-up labs with PCP.  7. Disposition: Follow-up as scheduled with Dr. Berry Bristol in 10/2023.  HYPERTENSION CONTROL Vitals:   09/10/23 0832 09/10/23 0848  BP: (!) 158/100 (!) 148/98    The patient's blood pressure is elevated above target today.  In order to address the patient's elevated BP: Blood pressure will be monitored at home to determine if medication changes need to be made.; A new medication was prescribed today.; Follow up with primary care provider for management.; Follow up with  general cardiology has been recommended.      Jude Norton, NP 09/10/2023, 4:15 PM

## 2023-09-10 NOTE — Patient Instructions (Signed)
 Medication Instructions:  Your physician recommends that you continue on your current medications as directed. Please refer to the Current Medication list given to you today.  *If you need a refill on your cardiac medications before your next appointment, please call your pharmacy*  Lab Work: NONE ordered at this time of appointment   Testing/Procedures: NONE ordered at this time of appointment   Follow-Up: At [facility], you and your health needs are our priority.  As part of our continuing mission to provide you with exceptional heart care, our providers are all part of one team.  This team includes your primary Cardiologist (physician) and Advanced Practice Providers or APPs (Physician Assistants and Nurse Practitioners) who all work together to provide you with the care you need, when you need it.  Your next appointment:    Keep upcoming appointments   Provider:   Knox Perl, MD & Dr. Maximo Spar    We recommend signing up for the patient portal called "MyChart".  Sign up information is provided on this After Visit Summary.  MyChart is used to connect with patients for Virtual Visits (Telemedicine).  Patients are able to view lab/test results, encounter notes, upcoming appointments, etc.  Non-urgent messages can be sent to your provider as well.   To learn more about what you can do with MyChart, go to [email or web address].   Other Instructions Monitor Blood pressure. Goal BP is 130/80 or less.

## 2023-09-30 ENCOUNTER — Other Ambulatory Visit (HOSPITAL_COMMUNITY)

## 2023-10-01 ENCOUNTER — Ambulatory Visit: Admitting: Urology

## 2023-10-01 NOTE — Progress Notes (Deleted)
   Assessment: 1. Organic impotence      Plan: I personally reviewed the patient's chart including provider notes, ***.   Chief Complaint: No chief complaint on file.   History of Present Illness:  Francisco Carey is a 38 y.o. male who is seen in consultation from Rosalea Rosina SAILOR, [place] for evaluation of ***.   Past Medical History:  Past Medical History:  Diagnosis Date   Diabetes mellitus without complication (HCC)    ED (erectile dysfunction)    Edema of both lower extremities    HLD (hyperlipidemia)    Hypertension    Overweight     Past Surgical History:  Past Surgical History:  Procedure Laterality Date   KNEE SURGERY     KNEE SURGERY      Allergies:  Allergies  Allergen Reactions   Amlodipine  Swelling    Body didn't respond well to it. Swelling, edema lower legs     Family History:  Family History  Problem Relation Age of Onset   Healthy Father    Healthy Sister     Social History:  Social History   Tobacco Use   Smoking status: Never   Smokeless tobacco: Never  Vaping Use   Vaping status: Never Used  Substance Use Topics   Alcohol use: Not Currently    Comment: occ   Drug use: No    Review of symptoms:  Constitutional:  Negative for unexplained weight loss, night sweats, fever, chills ENT:  Negative for nose bleeds, sinus pain, painful swallowing CV:  Negative for chest pain, shortness of breath, exercise intolerance, palpitations, loss of consciousness Resp:  Negative for cough, wheezing, shortness of breath GI:  Negative for nausea, vomiting, diarrhea, bloody stools GU:  Positives noted in HPI; otherwise negative for gross hematuria, dysuria, urinary incontinence Neuro:  Negative for seizures, poor balance, limb weakness, slurred speech Psych:  Negative for lack of energy, depression, anxiety Endocrine:  Negative for polydipsia, polyuria, symptoms of hypoglycemia (dizziness, hunger, sweating) Hematologic:  Negative for anemia,  purpura, petechia, prolonged or excessive bleeding, use of anticoagulants  Allergic:  Negative for difficulty breathing or choking as a result of exposure to anything; no shellfish allergy; no allergic response (rash/itch) to materials, foods  Physical exam: There were no vitals taken for this visit. GENERAL APPEARANCE:  Well appearing, well developed, well nourished, NAD HEENT: Atraumatic, Normocephalic, oropharynx clear. NECK: Supple without lymphadenopathy or thyromegaly. LUNGS: Clear to auscultation bilaterally. HEART: Regular Rate and Rhythm without murmurs, gallops, or rubs. ABDOMEN: Soft, non-tender, No Masses. EXTREMITIES: Moves all extremities well.  Without clubbing, cyanosis, or edema. NEUROLOGIC:  Alert and oriented x 3, normal gait, CN II-XII grossly intact.  MENTAL STATUS:  Appropriate. BACK:  Non-tender to palpation.  No CVAT SKIN:  Warm, dry and intact.   GU: Penis:  {Exam; penis:5791} Meatus: {Meatus:15530} Scrotum: {pe scrotum:310183} Testis: {Exam; testicles:5790} Epididymis: {epididymis zkjf:688612}   Results: No results found for this or any previous visit (from the past 24 hours).

## 2023-10-22 ENCOUNTER — Ambulatory Visit: Attending: Cardiology | Admitting: Cardiology

## 2023-10-22 NOTE — Progress Notes (Deleted)
 Cardiology Office Note:  .   Date:  10/22/2023  ID:  Francisco Carey, DOB Feb 06, 1986, MRN [id number] PCP: Rosalea Rosina SAILOR, [place]  [facility] Providers Cardiologist:  Gordy Bergamo, MD { Click to update primary MD,subspecialty MD or APP then REFRESH:1}  History of Present Illness: .   Francisco Carey is a 38 y.o.  [facility] patient with uncontrolled HTN, DM, familial hyperlipidemia, chronic palpitations in the form of skipped beats.  His echocardiogram on 08/28/2023 revealing normal LVEF with moderate LVH.  On his last office visit when he established with me on 08/22/2023, I recommended initiation of labetalol  and amlodipine , clonidine  0.2 mg tablet twice daily and switched losartan  50 mg to losartan  HCT 100/12.5 mg daily and referred him for lipid clinic and also schedule him for a home sleep test.  He discontinued labetalol  stating that he did not feel well.  Ultrasound of the kidneys on 08/29/2022 revealed normal-appearing kidneys bilaterally.  He presented on 08/31/2023 to the emergency room with hypertensive urgency and acute kidney injury, losartan  HCT was held, continued labetalol  and amlodipine  and recommended outpatient follow-up of serum creatinine.  Baseline creatinine of 1.2 had risen to 1.78.  Discussed the use of AI scribe software for clinical note transcription with the patient, who gave verbal consent to proceed.  History of Present Illness   Labs   No results found for: LIPOA  Lab Results  Component Value Date   NA 132 (L) 08/31/2023   K 4.2 08/31/2023   CO2 20 (L) 08/31/2023   GLUCOSE 77 08/31/2023   BUN 37 (H) 08/31/2023   CREATININE 1.70 (H) 08/31/2023   CALCIUM 8.0 (L) 08/31/2023   EGFR 71.0 05/21/2023   GFRNONAA 53 (L) 08/31/2023      Latest Ref Rng & Units 08/31/2023    5:51 AM 08/30/2023    6:11 AM 08/29/2023    4:46 AM  BMP  Glucose 70 - 99 mg/dL 77  98  847   BUN 6 - 20 mg/dL 37  35  41   Creatinine 0.61 - 1.24 mg/dL 8.29  8.40  8.06   Sodium 135  - 145 mmol/L 132  134  135   Potassium 3.5 - 5.1 mmol/L 4.2  4.0  4.0   Chloride 98 - 111 mmol/L 103  104  102   CO2 22 - 32 mmol/L 20  22  25    Calcium 8.9 - 10.3 mg/dL 8.0  8.2  8.2       Latest Ref Rng & Units 08/31/2023    5:51 AM 08/30/2023    6:34 AM 08/29/2023    4:46 AM  CBC  WBC 4.0 - 10.5 K/uL 5.9  7.0  6.9   Hemoglobin 13.0 - 17.0 g/dL 88.9  88.2  88.4   Hematocrit 39.0 - 52.0 % 34.0  36.6  35.8   Platelets 150 - 400 K/uL 283  292  310    Lab Results  Component Value Date   HGBA1C 6.4 (H) 08/28/2023    Lab Results  Component Value Date   TSH 6.349 (H) 08/29/2023    External Labs:  PCP labs on Care Everywhere 09/08/2023:  A1c 7.1%.  Labs 09/03/2023:  Serum glucose 99 mg, BUN 45, creatinine 1.77, EGFR 51 mL.  Total cholesterol 307, triglycerides 245, HDL 42, LDL 220.  PCP faxed lab 05/21/2023:   A1c 9.5%.   TSH mildly elevated at 6.94, free T4 normal at 1.1.   Total cholesterol 360, triglycerides 746, HDL  60.  Non-HDL cholesterol 300.   Serum glucose 309 mg, BUN 29, creatinine 1.33, EGFR 71 mL, potassium 4.3.  ROS  ***ROS Physical Exam:   VS:  There were no vitals taken for this visit.   Wt Readings from Last 3 Encounters:  09/10/23 228 lb (103.4 kg)  08/27/23 235 lb (106.6 kg)  08/22/23 234 lb (106.1 kg)    ***Physical Exam Studies Reviewed: .    *** EKG:       ***  Medications ordered    No orders of the defined types were placed in this encounter.    ASSESSMENT AND PLAN: .    No diagnosis found.  Assessment and Plan Assessment & Plan      Signed,  Gordy Bergamo, MD, [facility] 10/22/2023, 7:22 AM [facility] [address] [place], [place] [address] Phone: [phone]. Fax:  [phone]

## 2023-10-23 ENCOUNTER — Encounter: Payer: Self-pay | Admitting: Cardiology

## 2023-11-03 ENCOUNTER — Ambulatory Visit: Admitting: Urology

## 2023-11-03 NOTE — Progress Notes (Shared)
 [facility] - Clinic Note  11/11/2023   CHIEF COMPLAINT Patient presents for No chief complaint on file.  HISTORY OF PRESENT ILLNESS: Francisco Carey is a 38 y.o. male who presents to the clinic today for:   Referring physician: Rosalea Rosina SAILOR, PA [address] [place],  [place] [address]  HISTORICAL INFORMATION:  Selected notes from the MEDICAL RECORD NUMBER Referred by Dr. JAMA:  Ocular Hx- PMH-   CURRENT MEDICATIONS: No current outpatient medications on file. (Ophthalmic Drugs)   No current facility-administered medications for this visit. (Ophthalmic Drugs)   Current Outpatient Medications (Other)  Medication Sig   amLODipine -olmesartan (AZOR) 5-40 MG tablet Take 1 tablet by mouth daily. HASN'T STARTED YET   aspirin  EC 81 MG tablet Take 1 tablet (81 mg total) by mouth daily. Swallow whole.   cloNIDine  (CATAPRES ) 0.3 MG tablet Take 1 tablet (0.3 mg total) by mouth 2 (two) times daily.   FARXIGA 10 MG TABS tablet Take 10 mg by mouth daily.   glimepiride  (AMARYL ) 2 MG tablet Take 2 mg by mouth daily.   rosuvastatin (CRESTOR) 40 MG tablet Take 40 mg by mouth daily.   sildenafil  (VIAGRA ) 50 MG tablet Take 1 tablet (50 mg total) by mouth daily as needed for erectile dysfunction.   No current facility-administered medications for this visit. (Other)   REVIEW OF SYSTEMS:  ALLERGIES Allergies  Allergen Reactions   Amlodipine  Swelling    Body didn't respond well to it. Swelling, edema lower legs    PAST MEDICAL HISTORY Past Medical History:  Diagnosis Date   Diabetes mellitus without complication (HCC)    ED (erectile dysfunction)    Edema of both lower extremities    HLD (hyperlipidemia)    Hypertension    Overweight    Past Surgical History:  Procedure Laterality Date   KNEE SURGERY     KNEE SURGERY     FAMILY HISTORY Family History  Problem Relation Age of Onset   Healthy Father    Healthy Sister    SOCIAL HISTORY Social  History   Tobacco Use   Smoking status: Never   Smokeless tobacco: Never  Vaping Use   Vaping status: Never Used  Substance Use Topics   Alcohol use: Not Currently    Comment: occ   Drug use: No       OPHTHALMIC EXAM:  Not recorded    IMAGING AND PROCEDURES  Imaging and Procedures for 11/11/2023        ASSESSMENT/PLAN:   ICD-10-CM   1. Retinal edema of both eyes  H35.81      1.  2.  3.  Ophthalmic Meds Ordered this visit:  No orders of the defined types were placed in this encounter.    No follow-ups on file.  There are no Patient Instructions on file for this visit.  Explained the diagnoses, plan, and follow up with the patient and they expressed understanding.  Patient expressed understanding of the importance of proper follow up care.   This document serves as a record of services personally performed by Redell JUDITHANN Hans, MD, PhD. It was created on their behalf by Avelina Pereyra, COA an ophthalmic technician. The creation of this record is the provider's dictation and/or activities during the visit.   Electronically signed by: Avelina GORMAN Pereyra, COT  11/03/23  2:42 PM   Redell JUDITHANN Hans, M.D., Ph.D. Diseases & Surgery of the Retina and Vitreous [facility] 11/11/2023  Abbreviations: M myopia (nearsighted); A astigmatism; H hyperopia (farsighted); P presbyopia; Mrx spectacle prescription;  CTL contact lenses; OD right eye; OS left eye; OU both eyes  XT exotropia; ET esotropia; PEK punctate epithelial keratitis; PEE punctate epithelial erosions; DES dry eye syndrome; MGD meibomian gland dysfunction; ATs artificial tears; PFAT's preservative free artificial tears; NSC nuclear sclerotic cataract; PSC posterior subcapsular cataract; ERM epi-retinal membrane; PVD posterior vitreous detachment; RD retinal detachment; DM diabetes mellitus; DR diabetic retinopathy; NPDR non-proliferative diabetic retinopathy; PDR proliferative diabetic retinopathy; CSME  clinically significant macular edema; DME diabetic macular edema; dbh dot blot hemorrhages; CWS cotton wool spot; POAG primary open angle glaucoma; C/D cup-to-disc ratio; HVF humphrey visual field; GVF goldmann visual field; OCT optical coherence tomography; IOP intraocular pressure; BRVO Branch retinal vein occlusion; CRVO central retinal vein occlusion; CRAO central retinal artery occlusion; BRAO branch retinal artery occlusion; RT retinal tear; SB scleral buckle; PPV pars plana vitrectomy; VH Vitreous hemorrhage; PRP panretinal laser photocoagulation; IVK intravitreal kenalog; VMT vitreomacular traction; MH Macular hole;  NVD neovascularization of the disc; NVE neovascularization elsewhere; AREDS age related eye disease study; ARMD age related macular degeneration; POAG primary open angle glaucoma; EBMD epithelial/anterior basement membrane dystrophy; ACIOL anterior chamber intraocular lens; IOL intraocular lens; PCIOL posterior chamber intraocular lens; Phaco/IOL phacoemulsification with intraocular lens placement; PRK photorefractive keratectomy; LASIK laser assisted in situ keratomileusis; HTN hypertension; DM diabetes mellitus; COPD chronic obstructive pulmonary disease

## 2023-11-03 NOTE — Progress Notes (Deleted)
   Assessment: 1. Erectile dysfunction, unspecified erectile dysfunction type     Plan: I personally reviewed the patient's chart including provider notes, and lab results.   Chief Complaint: No chief complaint on file.   History of Present Illness:  Francisco Carey is a 38 y.o. male who is seen in consultation from Rosalea Rosina SAILOR, [place] for evaluation of erectile dysfunction.   Past Medical History:  Past Medical History:  Diagnosis Date   Diabetes mellitus without complication (HCC)    ED (erectile dysfunction)    Edema of both lower extremities    HLD (hyperlipidemia)    Hypertension    Overweight     Past Surgical History:  Past Surgical History:  Procedure Laterality Date   KNEE SURGERY     KNEE SURGERY      Allergies:  Allergies  Allergen Reactions   Amlodipine  Swelling    Body didn't respond well to it. Swelling, edema lower legs     Family History:  Family History  Problem Relation Age of Onset   Healthy Father    Healthy Sister     Social History:  Social History   Tobacco Use   Smoking status: Never   Smokeless tobacco: Never  Vaping Use   Vaping status: Never Used  Substance Use Topics   Alcohol use: Not Currently    Comment: occ   Drug use: No    Review of symptoms:  Constitutional:  Negative for unexplained weight loss, night sweats, fever, chills ENT:  Negative for nose bleeds, sinus pain, painful swallowing CV:  Negative for chest pain, shortness of breath, exercise intolerance, palpitations, loss of consciousness Resp:  Negative for cough, wheezing, shortness of breath GI:  Negative for nausea, vomiting, diarrhea, bloody stools GU:  Positives noted in HPI; otherwise negative for gross hematuria, dysuria, urinary incontinence Neuro:  Negative for seizures, poor balance, limb weakness, slurred speech Psych:  Negative for lack of energy, depression, anxiety Endocrine:  Negative for polydipsia, polyuria, symptoms of hypoglycemia  (dizziness, hunger, sweating) Hematologic:  Negative for anemia, purpura, petechia, prolonged or excessive bleeding, use of anticoagulants  Allergic:  Negative for difficulty breathing or choking as a result of exposure to anything; no shellfish allergy; no allergic response (rash/itch) to materials, foods  Physical exam: There were no vitals taken for this visit. GENERAL APPEARANCE:  Well appearing, well developed, well nourished, NAD HEENT: Atraumatic, Normocephalic, oropharynx clear. NECK: Supple without lymphadenopathy or thyromegaly. LUNGS: Clear to auscultation bilaterally. HEART: Regular Rate and Rhythm without murmurs, gallops, or rubs. ABDOMEN: Soft, non-tender, No Masses. EXTREMITIES: Moves all extremities well.  Without clubbing, cyanosis, or edema. NEUROLOGIC:  Alert and oriented x 3, normal gait, CN II-XII grossly intact.  MENTAL STATUS:  Appropriate. BACK:  Non-tender to palpation.  No CVAT SKIN:  Warm, dry and intact.  GU: Penis:  {Exam; penis:5791} Meatus: {Meatus:15530} Scrotum: {pe scrotum:310183} Testis: {Exam; testicles:5790} Epididymis: {epididymis zkjf:688612}   Results: None

## 2023-11-11 ENCOUNTER — Encounter (INDEPENDENT_AMBULATORY_CARE_PROVIDER_SITE_OTHER): Admitting: Ophthalmology

## 2023-11-11 DIAGNOSIS — H3581 Retinal edema: Secondary | ICD-10-CM

## 2023-12-10 ENCOUNTER — Ambulatory Visit: Admitting: Endocrinology

## 2023-12-10 NOTE — Progress Notes (Shared)
 [facility] - Clinic Note  12/22/2023   CHIEF COMPLAINT Patient presents for No chief complaint on file.  HISTORY OF PRESENT ILLNESS: Francisco Carey is a 38 y.o. male who presents to the clinic today for:   Referring physician: Rosalea Rosina SAILOR, PA [address] [place],  [place] [address]  HISTORICAL INFORMATION:  Selected notes from the MEDICAL RECORD NUMBER Referred by Dr. JAMA:  Ocular Hx- PMH-   CURRENT MEDICATIONS: No current outpatient medications on file. (Ophthalmic Drugs)   No current facility-administered medications for this visit. (Ophthalmic Drugs)   Current Outpatient Medications (Other)  Medication Sig   amLODipine -olmesartan (AZOR) 5-40 MG tablet Take 1 tablet by mouth daily. HASN'T STARTED YET   aspirin  EC 81 MG tablet Take 1 tablet (81 mg total) by mouth daily. Swallow whole.   cloNIDine  (CATAPRES ) 0.3 MG tablet Take 1 tablet (0.3 mg total) by mouth 2 (two) times daily.   FARXIGA 10 MG TABS tablet Take 10 mg by mouth daily.   glimepiride  (AMARYL ) 2 MG tablet Take 2 mg by mouth daily.   rosuvastatin (CRESTOR) 40 MG tablet Take 40 mg by mouth daily.   sildenafil  (VIAGRA ) 50 MG tablet Take 1 tablet (50 mg total) by mouth daily as needed for erectile dysfunction.   No current facility-administered medications for this visit. (Other)   REVIEW OF SYSTEMS:  ALLERGIES Allergies  Allergen Reactions   Amlodipine  Swelling    Body didn't respond well to it. Swelling, edema lower legs    PAST MEDICAL HISTORY Past Medical History:  Diagnosis Date   Diabetes mellitus without complication (HCC)    ED (erectile dysfunction)    Edema of both lower extremities    HLD (hyperlipidemia)    Hypertension    Overweight    Past Surgical History:  Procedure Laterality Date   KNEE SURGERY     KNEE SURGERY     FAMILY HISTORY Family History  Problem Relation Age of Onset   Healthy Father    Healthy Sister    SOCIAL HISTORY Social  History   Tobacco Use   Smoking status: Never   Smokeless tobacco: Never  Vaping Use   Vaping status: Never Used  Substance Use Topics   Alcohol use: Not Currently    Comment: occ   Drug use: No       OPHTHALMIC EXAM:  Not recorded    IMAGING AND PROCEDURES  Imaging and Procedures for 12/22/2023        ASSESSMENT/PLAN:   ICD-10-CM   1. Retinal edema of both eyes  H35.81       1.  2.  3.  Ophthalmic Meds Ordered this visit:  No orders of the defined types were placed in this encounter.    No follow-ups on file.  There are no Patient Instructions on file for this visit.  Explained the diagnoses, plan, and follow up with the patient and they expressed understanding.  Patient expressed understanding of the importance of proper follow up care.   This document serves as a record of services personally performed by Redell JUDITHANN Hans, MD, PhD. It was created on their behalf by Avelina Pereyra, COA an ophthalmic technician. The creation of this record is the provider's dictation and/or activities during the visit.   Electronically signed by: Avelina GORMAN Pereyra, COT  12/10/23  10:28 AM   Redell JUDITHANN Hans, M.D., Ph.D. Diseases & Surgery of the Retina and Vitreous [facility] 12/22/2023  Abbreviations: M myopia (nearsighted); A astigmatism; H hyperopia (farsighted); P presbyopia; Mrx spectacle prescription;  CTL contact lenses; OD right eye; OS left eye; OU both eyes  XT exotropia; ET esotropia; PEK punctate epithelial keratitis; PEE punctate epithelial erosions; DES dry eye syndrome; MGD meibomian gland dysfunction; ATs artificial tears; PFAT's preservative free artificial tears; NSC nuclear sclerotic cataract; PSC posterior subcapsular cataract; ERM epi-retinal membrane; PVD posterior vitreous detachment; RD retinal detachment; DM diabetes mellitus; DR diabetic retinopathy; NPDR non-proliferative diabetic retinopathy; PDR proliferative diabetic retinopathy;  CSME clinically significant macular edema; DME diabetic macular edema; dbh dot blot hemorrhages; CWS cotton wool spot; POAG primary open angle glaucoma; C/D cup-to-disc ratio; HVF humphrey visual field; GVF goldmann visual field; OCT optical coherence tomography; IOP intraocular pressure; BRVO Branch retinal vein occlusion; CRVO central retinal vein occlusion; CRAO central retinal artery occlusion; BRAO branch retinal artery occlusion; RT retinal tear; SB scleral buckle; PPV pars plana vitrectomy; VH Vitreous hemorrhage; PRP panretinal laser photocoagulation; IVK intravitreal kenalog; VMT vitreomacular traction; MH Macular hole;  NVD neovascularization of the disc; NVE neovascularization elsewhere; AREDS age related eye disease study; ARMD age related macular degeneration; POAG primary open angle glaucoma; EBMD epithelial/anterior basement membrane dystrophy; ACIOL anterior chamber intraocular lens; IOL intraocular lens; PCIOL posterior chamber intraocular lens; Phaco/IOL phacoemulsification with intraocular lens placement; PRK photorefractive keratectomy; LASIK laser assisted in situ keratomileusis; HTN hypertension; DM diabetes mellitus; COPD chronic obstructive pulmonary disease

## 2023-12-19 ENCOUNTER — Institutional Professional Consult (permissible substitution) (HOSPITAL_BASED_OUTPATIENT_CLINIC_OR_DEPARTMENT_OTHER): Admitting: Internal Medicine

## 2023-12-22 ENCOUNTER — Encounter (INDEPENDENT_AMBULATORY_CARE_PROVIDER_SITE_OTHER): Payer: Self-pay

## 2023-12-22 ENCOUNTER — Telehealth: Payer: Self-pay

## 2023-12-22 ENCOUNTER — Encounter (INDEPENDENT_AMBULATORY_CARE_PROVIDER_SITE_OTHER): Admitting: Ophthalmology

## 2023-12-22 DIAGNOSIS — H3581 Retinal edema: Secondary | ICD-10-CM

## 2023-12-22 NOTE — Telephone Encounter (Signed)
**Note De-Identified Francisco Carey Obfuscation** Per the [email or web address] website a PA is not required for CPT Code: G0399 (Home Sleep Study Type 3):  I have transferred the order to the sleep lab. I called the pt and made him aware that a PA is not required per [employer] for his home sleep study and I provided him with the Sleep Lab's phone number so he can call them to be scheduled. He verbalized understanding to all information given and thanked me for my call.

## 2024-01-06 ENCOUNTER — Encounter (HOSPITAL_BASED_OUTPATIENT_CLINIC_OR_DEPARTMENT_OTHER): Admitting: Cardiology

## 2024-01-29 ENCOUNTER — Institutional Professional Consult (permissible substitution) (HOSPITAL_BASED_OUTPATIENT_CLINIC_OR_DEPARTMENT_OTHER): Admitting: Internal Medicine

## 2024-02-02 ENCOUNTER — Encounter (HOSPITAL_BASED_OUTPATIENT_CLINIC_OR_DEPARTMENT_OTHER): Payer: Self-pay | Admitting: Internal Medicine

## 2024-02-12 ENCOUNTER — Ambulatory Visit: Admitting: Endocrinology
